# Patient Record
Sex: Male | Born: 1935 | Race: White | Hispanic: No | Marital: Married | Smoking: Former smoker
Health system: Southern US, Community
[De-identification: ages and names within clinical notes are randomized; demographics above are authoritative.]

## PROBLEM LIST (undated history)

## (undated) DIAGNOSIS — E785 Hyperlipidemia, unspecified: Secondary | ICD-10-CM

## (undated) DIAGNOSIS — E538 Deficiency of other specified B group vitamins: Secondary | ICD-10-CM

## (undated) DIAGNOSIS — M51369 Other intervertebral disc degeneration, lumbar region without mention of lumbar back pain or lower extremity pain: Secondary | ICD-10-CM

## (undated) DIAGNOSIS — N400 Enlarged prostate without lower urinary tract symptoms: Secondary | ICD-10-CM

## (undated) DIAGNOSIS — R972 Elevated prostate specific antigen [PSA]: Secondary | ICD-10-CM

## (undated) DIAGNOSIS — G629 Polyneuropathy, unspecified: Secondary | ICD-10-CM

## (undated) DIAGNOSIS — J849 Interstitial pulmonary disease, unspecified: Secondary | ICD-10-CM

## (undated) DIAGNOSIS — I2510 Atherosclerotic heart disease of native coronary artery without angina pectoris: Secondary | ICD-10-CM

## (undated) DIAGNOSIS — M47816 Spondylosis without myelopathy or radiculopathy, lumbar region: Secondary | ICD-10-CM

## (undated) DIAGNOSIS — I714 Abdominal aortic aneurysm, without rupture, unspecified: Secondary | ICD-10-CM

## (undated) DIAGNOSIS — I10 Essential (primary) hypertension: Secondary | ICD-10-CM

## (undated) DIAGNOSIS — N182 Chronic kidney disease, stage 2 (mild): Secondary | ICD-10-CM

## (undated) DIAGNOSIS — M1990 Unspecified osteoarthritis, unspecified site: Secondary | ICD-10-CM

## (undated) DIAGNOSIS — K589 Irritable bowel syndrome without diarrhea: Secondary | ICD-10-CM

## (undated) DIAGNOSIS — M5416 Radiculopathy, lumbar region: Secondary | ICD-10-CM

## (undated) DIAGNOSIS — E559 Vitamin D deficiency, unspecified: Secondary | ICD-10-CM

## (undated) DIAGNOSIS — I219 Acute myocardial infarction, unspecified: Secondary | ICD-10-CM

## (undated) DIAGNOSIS — K219 Gastro-esophageal reflux disease without esophagitis: Secondary | ICD-10-CM

## (undated) HISTORY — PX: CORONARY ARTERY BYPASS GRAFT: SHX141

## (undated) HISTORY — PX: PACEMAKER IMPLANT: EP1218

---

## 2018-06-08 ENCOUNTER — Other Ambulatory Visit: Payer: Self-pay | Admitting: Urology

## 2018-06-08 DIAGNOSIS — N2889 Other specified disorders of kidney and ureter: Secondary | ICD-10-CM

## 2018-06-12 ENCOUNTER — Other Ambulatory Visit: Payer: Self-pay

## 2018-07-04 ENCOUNTER — Ambulatory Visit
Admission: RE | Admit: 2018-07-04 | Discharge: 2018-07-04 | Disposition: A | Discharge status: Home / Self Care | Payer: BLUE CROSS/BLUE SHIELD | Source: Ambulatory Visit | Attending: Urology | Admitting: Urology

## 2018-07-04 ENCOUNTER — Encounter: Payer: Self-pay | Admitting: *Deleted

## 2018-07-04 ENCOUNTER — Other Ambulatory Visit: Payer: Self-pay

## 2018-07-04 DIAGNOSIS — N2889 Other specified disorders of kidney and ureter: Secondary | ICD-10-CM

## 2018-07-04 HISTORY — PX: IR RADIOLOGIST EVAL & MGMT: IMG5224

## 2018-07-04 NOTE — Progress Notes (Signed)
Patient ID: Rodney Ross, male   DOB: 07/23/1934, 84 y.o.   MRN: [id number]       Chief Complaint: Patient was consulted remotely today (TeleHealth) for a small right renal mass at the request of Hall,Marshall C.    Referring Physician(s): Hall,Marshall C  History of Present Illness: Rodney Ross is a 84 y.o. male with an incidental 2 cm right lower pole exophytic renal mass and recent episode of gross hematuria.  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  This was found he was recently in the hospital for acute diverticulitis and bacteremia.  He has recovered from the diverticulitis.  He was discharged without difficulty.  He has had urology follow-up with Dr. Nevada Crane.  He has comorbidities including his age, known coronary disease, previous coronary bypass, but remains fairly active.  He has moved back to to [place] in the last few years after living at the [facility].  No past medical history on file.    Allergies: Patient has no allergy information on record.  Medications: Prior to Admission medications   Not on File     No family history on file.  Social History   Socioeconomic History  . Marital status: Not on file    Spouse name: Not on file  . Number of children: Not on file  . Years of education: Not on file  . Highest education level: Not on file  Occupational History  . Not on file  Social Needs  . Financial resource strain: Not on file  . Food insecurity    Worry: Not on file    Inability: Not on file  . Transportation needs    Medical: Not on file    Non-medical: Not on file  Tobacco Use  . Smoking status: Not on file  Substance and Sexual Activity  . Alcohol use: Not on file  . Drug use: Not on file  . Sexual activity: Not on file  Lifestyle  . Physical activity    Days per week: Not on file    Minutes per session: Not on file  . Stress: Not on file  Relationships  . Social Herbalist on phone: Not on file    Gets  together: Not on file    Attends religious service: Not on file    Active member of club or organization: Not on file    Attends meetings of clubs or organizations: Not on file    Relationship status: Not on file  Other Topics Concern  . Not on file  Social History Narrative  . Not on file    Review of Systems  Review of Systems: A 12 point ROS discussed and pertinent positives are indicated in the HPI above.  All other systems are negative.  Physical Exam No direct physical exam was performed  Vital Signs: There were no vitals taken for this visit.  Imaging: No results found.  Labs:  CBC: No results for input(s): WBC, HGB, HCT, PLT in the last 8760 hours.  COAGS: No results for input(s): INR, APTT in the last 8760 hours.  BMP: No results for input(s): NA, K, CL, CO2, GLUCOSE, BUN, CALCIUM, CREATININE, GFRNONAA, GFRAA in the last 8760 hours.  Invalid input(s): CMP  LIVER FUNCTION TESTS: No results for input(s): BILITOT, AST, ALT, ALKPHOS, PROT, ALBUMIN in the last 8760 hours.  TUMOR MARKERS: No results for input(s): AFPTM, CEA, CA199, CHROMGRNA in the last 8760 hours.  Assessment and Plan:  84 year old male with  a incidental 2 cm right lower pole renal mass concerning for renal cell carcinoma by CT imaging with an episode of gross hematuria.  Today, I reevaluated the density measurements of the lesion.  There is a suggestion of possible fat density on the initial noncontrast CT.  Therefore, it is not entirely certain if this is a renal cell carcinoma versus a lipid poor angiomyolipoma.  Unfortunately MRI cannot be performed because the patient has a pacemaker.  Given his age and comorbidities main treatment options discussed were observation with serial imaging versus cryoablation.  For the gross hematuria he does plan to have elective cystoscopy in the next few weeks by Dr. Margo AyeHall.  The ablation procedure was reviewed in detail including the procedure, risk, benefits  and alternatives.  Expected goals, recovery and outcomes well as continued surveillance were all reviewed.  Biopsy could also performed at the time of ablation.  Surveillance also discussed.  After discussion, the patient is most comfortable with observation and serial imaging.  Certainly if the lesion shows any enlargement he still would be a candidate for ablation.  Plan: Repeat outpatient renal mass CT protocol in 6 months (January 2021)  Thank you for this interesting consult.  I greatly enjoyed meeting Rodney Ross and look forward to participating in their care.  A copy of this report was sent to the requesting provider on this date.  Electronically Signed: Berdine DanceMichael Grainger Mccarley 07/04/2018, 1:54 PM   I spent a total of  30 Minutes   in remote  clinical consultation, greater than 50% of which was counseling/coordinating care for this patient with a right renal mass.    Visit type: Audio only (telephone). Audio (no video) only due to Not necessary for this clinic visit. Alternative for in-person consultation at [facility], [address], [place], [place]. This visit type was conducted due to national recommendations for restrictions regarding the COVID-19 Pandemic (e.g. social distancing).  This format is felt to be most appropriate for this patient at this time.  All issues noted in this document were discussed and addressed.

## 2019-02-08 ENCOUNTER — Other Ambulatory Visit: Payer: Self-pay | Admitting: *Deleted

## 2019-02-08 DIAGNOSIS — N2889 Other specified disorders of kidney and ureter: Secondary | ICD-10-CM

## 2019-02-12 ENCOUNTER — Other Ambulatory Visit: Payer: Self-pay | Admitting: Interventional Radiology

## 2019-02-12 DIAGNOSIS — N2889 Other specified disorders of kidney and ureter: Secondary | ICD-10-CM

## 2019-02-27 ENCOUNTER — Ambulatory Visit
Admission: RE | Admit: 2019-02-27 | Discharge: 2019-02-27 | Disposition: A | Discharge status: Home / Self Care | Payer: BLUE CROSS/BLUE SHIELD | Source: Ambulatory Visit | Attending: Interventional Radiology | Admitting: Interventional Radiology

## 2019-02-27 ENCOUNTER — Other Ambulatory Visit: Payer: Self-pay

## 2019-02-27 DIAGNOSIS — N2889 Other specified disorders of kidney and ureter: Secondary | ICD-10-CM

## 2019-06-13 ENCOUNTER — Other Ambulatory Visit: Payer: Self-pay

## 2019-06-13 ENCOUNTER — Ambulatory Visit
Admission: RE | Admit: 2019-06-13 | Discharge: 2019-06-13 | Disposition: A | Discharge status: Home / Self Care | Payer: BLUE CROSS/BLUE SHIELD | Source: Ambulatory Visit | Attending: Interventional Radiology | Admitting: Interventional Radiology

## 2019-06-13 ENCOUNTER — Encounter: Payer: Self-pay | Admitting: *Deleted

## 2019-06-13 HISTORY — PX: IR RADIOLOGIST EVAL & MGMT: IMG5224

## 2019-06-13 NOTE — Progress Notes (Signed)
Patient ID: Rodney Ross, male   DOB: 25-Jul-1934, 85 y.o.   MRN: [id number]       Chief Complaint:  Small right renal mass by imaging compatible with renal cell carcinoma  Referring Physician(s): Dr. Nevada Crane  History of Present Illness: Rodney Ross is a 85 y.o. male with multiple comorbidities including elderly age, known coronary disease, previous coronary bypass, and prior GI bleed.  He was incidentally found to have a small right lower pole exophytic renal mass measuring 1.4 cm by CT which was performed for abdominal pain.  At that time he actually had diverticulitis.  He has recovered from this.  Overall he is stable and back to his baseline.  Recent GI bleed was treated at [facility] and late February 2021.  No recurrent bleeding episodes.  No past medical history on file.    Allergies: Patient has no allergy information on record.  Medications: Prior to Admission medications   Not on File     No family history on file.  Social History   Socioeconomic History  . Marital status: Not on file    Spouse name: Not on file  . Number of children: Not on file  . Years of education: Not on file  . Highest education level: Not on file  Occupational History  . Not on file  Tobacco Use  . Smoking status: Not on file  Substance and Sexual Activity  . Alcohol use: Not on file  . Drug use: Not on file  . Sexual activity: Not on file  Other Topics Concern  . Not on file  Social History Narrative  . Not on file   Social Determinants of Health   Financial Resource Strain:   . Difficulty of Paying Living Expenses:   Food Insecurity:   . Worried About Charity fundraiser in the Last Year:   . Arboriculturist in the Last Year:   Transportation Needs:   . Film/video editor (Medical):   Marland Kitchen Lack of Transportation (Non-Medical):   Physical Activity:   . Days of Exercise per Week:   . Minutes of Exercise per Session:   Stress:   . Feeling of Stress :   Social  Connections:   . Frequency of Communication with Friends and Family:   . Frequency of Social Gatherings with Friends and Family:   . Attends Religious Services:   . Active Member of Clubs or Organizations:   . Attends Archivist Meetings:   Marland Kitchen Marital Status:     Review of Systems  Review of Systems: A 12 point ROS discussed and pertinent positives are indicated in the HPI above.  All other systems are negative.  Physical Exam No direct physical exam was performed, telephone health visit today only. Vital Signs: There were no vitals taken for this visit.  Imaging: No results found.  Labs:  CBC: No results for input(s): WBC, HGB, HCT, PLT in the last 8760 hours.  COAGS: No results for input(s): INR, APTT in the last 8760 hours.  BMP: No results for input(s): NA, K, CL, CO2, GLUCOSE, BUN, CALCIUM, CREATININE, GFRNONAA, GFRAA in the last 8760 hours.  Invalid input(s): CMP  LIVER FUNCTION TESTS: No results for input(s): BILITOT, AST, ALT, ALKPHOS, PROT, ALBUMIN in the last 8760 hours.  TUMOR MARKERS: No results for input(s): AFPTM, CEA, CA199, CHROMGRNA in the last 8760 hours.  Assessment and Plan:  85 year old male with an incidentally found right lower pole renal mass compatible with  renal cell carcinoma by CT imaging.  Repeat surveillance imaging as 6 months was performed February 21, 2019 at [facility].  This demonstrates a stable 1.4 cm small exophytic posterior right renal mass with enhancement compatible with a small renal cell carcinoma.  No interval change in size.  No new renal abnormality.  He remains asymptomatic.  No recent flank or abdominal pain.  No interval fever or illness.  No hematuria.  Stability of the lesion supports continued surveillance at his age.  Plan: Repeat outpatient renal mass CT protocol in 6 months (November 2021) at [facility]  Electronically Signed: Berdine Dance 06/13/2019, 10:42 AM   I spent a total of     25 Minutes in remote  clinical consultation, greater than 50% of which was counseling/coordinating care for this patient with a small right renal mass..    Visit type: Audio only (telephone). Audio (no video) only due to patient's lack of internet/smartphone capability. Alternative for in-person consultation at [facility], [address] [place], [place], [place]. This visit type was conducted due to national recommendations for restrictions regarding the COVID-19 Pandemic (e.g. social distancing).  This format is felt to be most appropriate for this patient at this time.  All issues noted in this document were discussed and addressed.

## 2019-11-06 ENCOUNTER — Other Ambulatory Visit: Payer: Self-pay

## 2019-11-06 ENCOUNTER — Other Ambulatory Visit: Payer: Self-pay | Admitting: Interventional Radiology

## 2019-11-06 DIAGNOSIS — N2889 Other specified disorders of kidney and ureter: Secondary | ICD-10-CM

## 2019-11-27 ENCOUNTER — Ambulatory Visit
Admission: RE | Admit: 2019-11-27 | Discharge: 2019-11-27 | Disposition: A | Discharge status: Home / Self Care | Payer: BLUE CROSS/BLUE SHIELD | Source: Ambulatory Visit | Attending: Interventional Radiology | Admitting: Interventional Radiology

## 2019-11-27 ENCOUNTER — Other Ambulatory Visit: Payer: Self-pay

## 2019-11-27 ENCOUNTER — Encounter: Payer: Self-pay | Admitting: *Deleted

## 2019-11-27 DIAGNOSIS — N2889 Other specified disorders of kidney and ureter: Secondary | ICD-10-CM

## 2019-11-27 HISTORY — PX: IR RADIOLOGIST EVAL & MGMT: IMG5224

## 2019-11-27 NOTE — Progress Notes (Signed)
Patient ID: Rodney Ross, male   DOB: 26-Jan-1934, 85 y.o.   MRN: [id number]       Chief Complaint:  Small right renal mass by imaging concerning for renal cell carcinoma  Referring Physician(s): Dr. Margo Aye, [facility] urology  History of Present Illness: Rodney Ross is a 85 y.o. male who is followed closely for a 1.4 cm right lower pole renal mass.  Lesion was originally found in 2020 on a CT scan for abdominal pain.  At that time he had diverticulitis.  He has had interval surveillance imaging at [facility] 11/22/2019.  The right renal lesion is in the lower pole posterior exophytic location measuring 1.4 cm with some evidence of enhancement.  Imaging characteristics and size are stable.  No interval change or growth.  No other acute or significant renal abnormality.  Overall he remains at his baseline.  No past medical history on file.    Allergies: Patient has no allergy information on record.  Medications: Prior to Admission medications   Not on File     No family history on file.  Social History   Socioeconomic History  . Marital status: Not on file    Spouse name: Not on file  . Number of children: Not on file  . Years of education: Not on file  . Highest education level: Not on file  Occupational History  . Not on file  Tobacco Use  . Smoking status: Not on file  Substance and Sexual Activity  . Alcohol use: Not on file  . Drug use: Not on file  . Sexual activity: Not on file  Other Topics Concern  . Not on file  Social History Narrative  . Not on file   Social Determinants of Health   Financial Resource Strain:   . Difficulty of Paying Living Expenses: Not on file  Food Insecurity:   . Worried About Programme researcher, broadcasting/film/video in the Last Year: Not on file  . Ran Out of Food in the Last Year: Not on file  Transportation Needs:   . Lack of Transportation (Medical): Not on file  . Lack of Transportation (Non-Medical): Not on file  Physical Activity:    . Days of Exercise per Week: Not on file  . Minutes of Exercise per Session: Not on file  Stress:   . Feeling of Stress : Not on file  Social Connections:   . Frequency of Communication with Friends and Family: Not on file  . Frequency of Social Gatherings with Friends and Family: Not on file  . Attends Religious Services: Not on file  . Active Member of Clubs or Organizations: Not on file  . Attends Banker Meetings: Not on file  . Marital Status: Not on file      Review of Systems  Review of Systems: A 12 point ROS discussed and pertinent positives are indicated in the HPI above.  All other systems are negative.  Physical Exam No direct physical exam was performed, telephone health visit only because of Covid pandemic   Vital Signs: There were no vitals taken for this visit.  Imaging: No results found.  Labs:  CBC: No results for input(s): WBC, HGB, HCT, PLT in the last 8760 hours.  COAGS: No results for input(s): INR, APTT in the last 8760 hours.  BMP: No results for input(s): NA, K, CL, CO2, GLUCOSE, BUN, CALCIUM, CREATININE, GFRNONAA, GFRAA in the last 8760 hours.  Invalid input(s): CMP  LIVER FUNCTION TESTS: No  results for input(s): BILITOT, AST, ALT, ALKPHOS, PROT, ALBUMIN in the last 8760 hours.   Assessment and Plan:  85 year old male with a 1.4 cm stable right lower pole renal mass imaging characteristics are compatible with a small renal cell carcinoma.  Surveillance CT performed 11/22/2019 confirms stability dating back to 05/14/2018.  No new renal abnormality.  He remains asymptomatic from a renal standpoint.  No flank pain, abdominal pain, dysuria or hematuria.  Stability of the renal lesion continues to support surveillance at his age.  Plan: Increase surveillance interval to 9 months with a repeat outpatient renal mass CT protocol at [facility] (August 2022).   Electronically Signed: Berdine Dance 11/27/2019, 8:46  AM   I spent a total of    25 Minutes in remote  clinical consultation, greater than 50% of which was counseling/coordinating care for this patient with a 1.4 cm stable right renal mass.    Visit type: Audio only (telephone). Audio (no video) only due to patient's lack of internet/smartphone capability. Alternative for in-person consultation at [facility], [address] [place], [place], [place]. This visit type was conducted due to national recommendations for restrictions regarding the COVID-19 Pandemic (e.g. social distancing).  This format is felt to be most appropriate for this patient at this time.  All issues noted in this document were discussed and addressed.

## 2020-07-23 ENCOUNTER — Other Ambulatory Visit: Payer: Self-pay | Admitting: Interventional Radiology

## 2020-07-23 ENCOUNTER — Other Ambulatory Visit: Payer: Self-pay

## 2020-07-23 DIAGNOSIS — N2889 Other specified disorders of kidney and ureter: Secondary | ICD-10-CM

## 2020-08-20 ENCOUNTER — Encounter: Payer: Self-pay | Admitting: *Deleted

## 2020-08-20 ENCOUNTER — Other Ambulatory Visit: Payer: Self-pay

## 2020-08-20 ENCOUNTER — Ambulatory Visit
Admission: RE | Admit: 2020-08-20 | Discharge: 2020-08-20 | Disposition: A | Discharge status: Home / Self Care | Payer: BLUE CROSS/BLUE SHIELD | Source: Ambulatory Visit | Attending: Interventional Radiology | Admitting: Interventional Radiology

## 2020-08-20 DIAGNOSIS — N2889 Other specified disorders of kidney and ureter: Secondary | ICD-10-CM

## 2020-08-20 HISTORY — PX: IR RADIOLOGIST EVAL & MGMT: IMG5224

## 2020-08-20 NOTE — Progress Notes (Signed)
Patient ID: Rodney Ross, male   DOB: June 11, 1934, 86 y.o.   MRN: [id number]       Chief Complaint:  Small posterior right renal mass concerning for renal cell carcinoma by imaging  Referring Physician(s): [facility]  History of Present Illness: Rodney Ross is a 86 y.o. male who is undergoing surveillance imaging for a 1.4 cm right lower pole renal mass.  Lesion was originally found in 2020 on a CT scan for abdominal pain when he had diverticulitis.  He has had interval surveillance for approximately 2 years.  Most recent CT performed 08/14/2020 at [facility] hospital.  Lesion remains a stable.  No interval growth.  Stable size at 1.4 cm.  No other acute or significant renal abnormality.  Patient remains asymptomatic.  No significant flank or abdominal pain.   No past medical history on file.    Allergies: Patient has no allergy information on record.  Medications: Prior to Admission medications   Not on File     No family history on file.  Social History   Socioeconomic History   Marital status: Not on file    Spouse name: Not on file   Number of children: Not on file   Years of education: Not on file   Highest education level: Not on file  Occupational History   Not on file  Tobacco Use   Smoking status: Not on file   Smokeless tobacco: Not on file  Substance and Sexual Activity   Alcohol use: Not on file   Drug use: Not on file   Sexual activity: Not on file  Other Topics Concern   Not on file  Social History Narrative   Not on file   Social Determinants of Health   Financial Resource Strain: Not on file  Food Insecurity: Not on file  Transportation Needs: Not on file  Physical Activity: Not on file  Stress: Not on file  Social Connections: Not on file     Review of Systems  Review of Systems: A 12 point ROS discussed and pertinent positives are indicated in the HPI above.  All other systems are negative.  Physical Exam No direct  physical exam was performed, telephone health visit only today Vital Signs: There were no vitals taken for this visit.  Imaging: No results found.  Labs:  CBC: No results for input(s): WBC, HGB, HCT, PLT in the last 8760 hours.  COAGS: No results for input(s): INR, APTT in the last 8760 hours.  BMP: No results for input(s): NA, K, CL, CO2, GLUCOSE, BUN, CALCIUM, CREATININE, GFRNONAA, GFRAA in the last 8760 hours.  Invalid input(s): CMP  LIVER FUNCTION TESTS: No results for input(s): BILITOT, AST, ALT, ALKPHOS, PROT, ALBUMIN in the last 8760 hours.  Assessment and Plan:  86 year old male with a stable 1.4 cm right lower pole renal mass with imaging characteristics compatible with a small renal neoplasm.  Surveillance imaging performed 08/14/2020 confirms stability of the lesion dating back to 05/14/2018.  No new renal abnormality.  No interval enlargement.  He remains asymptomatic from a renal standpoint.  No flank pain or abdominal pain.  Negative for dysuria.  He did have 1 episode of recent hematuria however a follow-up urinalysis at the primary care office demonstrated no red cells in his urine per patient report.  Continued stability of the right renal lesion supports surveillance given his age.  Plan: Continue surveillance at 9 months with a repeat outpatient renal mass CT protocol at [facility] (May 2023)  He also was instructed if he has recurrent gross hematuria that he may need to have his bladder evaluated at some point by urology.  Thank you for this interesting consult.  I greatly enjoyed meeting Rodney Ross and look forward to participating in their care.  A copy of this report was sent to the requesting provider on this date.  Electronically Signed: Berdine Dance 08/20/2020, 2:59 PM   I spent a total of    25 Minutes in remote  clinical consultation, greater than 50% of which was counseling/coordinating care for this patient with a stable right  renal mass..    Visit type: Audio only (telephone). Audio (no video) only due to patient's lack of internet/smartphone capability. Alternative for in-person consultation at [facility], [address] [place], [place], [place]. This visit type was conducted due to national recommendations for restrictions regarding the COVID-19 Pandemic (e.g. social distancing).  This format is felt to be most appropriate for this patient at this time.  All issues noted in this document were discussed and addressed.

## 2021-04-23 ENCOUNTER — Other Ambulatory Visit: Payer: Self-pay

## 2021-04-23 ENCOUNTER — Encounter (HOSPITAL_BASED_OUTPATIENT_CLINIC_OR_DEPARTMENT_OTHER): Payer: Self-pay | Admitting: *Deleted

## 2021-04-23 ENCOUNTER — Emergency Department (HOSPITAL_BASED_OUTPATIENT_CLINIC_OR_DEPARTMENT_OTHER)
Admission: EM | Admit: 2021-04-23 | Discharge: 2021-04-23 | Disposition: A | Discharge status: Home / Self Care | Payer: Medicare Other | Attending: Emergency Medicine | Admitting: Emergency Medicine

## 2021-04-23 ENCOUNTER — Emergency Department (HOSPITAL_BASED_OUTPATIENT_CLINIC_OR_DEPARTMENT_OTHER): Payer: Medicare Other

## 2021-04-23 DIAGNOSIS — M25561 Pain in right knee: Secondary | ICD-10-CM | POA: Insufficient documentation

## 2021-04-23 DIAGNOSIS — Z7982 Long term (current) use of aspirin: Secondary | ICD-10-CM | POA: Insufficient documentation

## 2021-04-23 DIAGNOSIS — G8929 Other chronic pain: Secondary | ICD-10-CM | POA: Diagnosis not present

## 2021-04-23 DIAGNOSIS — I10 Essential (primary) hypertension: Secondary | ICD-10-CM | POA: Insufficient documentation

## 2021-04-23 DIAGNOSIS — Z85528 Personal history of other malignant neoplasm of kidney: Secondary | ICD-10-CM | POA: Insufficient documentation

## 2021-04-23 DIAGNOSIS — M79604 Pain in right leg: Secondary | ICD-10-CM | POA: Diagnosis not present

## 2021-04-23 LAB — CBC WITH DIFFERENTIAL/PLATELET
Abs Immature Granulocytes: 0.03 10*3/uL (ref 0.00–0.07)
Basophils Absolute: 0 10*3/uL (ref 0.0–0.1)
Basophils Relative: 1 %
Eosinophils Absolute: 0.1 10*3/uL (ref 0.0–0.5)
Eosinophils Relative: 2 %
HCT: 40.5 % (ref 39.0–52.0)
Hemoglobin: 13.1 g/dL (ref 13.0–17.0)
Immature Granulocytes: 1 %
Lymphocytes Relative: 13 %
Lymphs Abs: 0.8 10*3/uL (ref 0.7–4.0)
MCH: 30 pg (ref 26.0–34.0)
MCHC: 32.3 g/dL (ref 30.0–36.0)
MCV: 92.9 fL (ref 80.0–100.0)
Monocytes Absolute: 0.5 10*3/uL (ref 0.1–1.0)
Monocytes Relative: 9 %
Neutro Abs: 4.6 10*3/uL (ref 1.7–7.7)
Neutrophils Relative %: 74 %
Platelets: 109 10*3/uL — ABNORMAL LOW (ref 150–400)
RBC: 4.36 MIL/uL (ref 4.22–5.81)
RDW: 17.4 % — ABNORMAL HIGH (ref 11.5–15.5)
WBC: 6 10*3/uL (ref 4.0–10.5)
nRBC: 0 % (ref 0.0–0.2)

## 2021-04-23 LAB — BASIC METABOLIC PANEL
Anion gap: 8 (ref 5–15)
BUN: 33 mg/dL — ABNORMAL HIGH (ref 8–23)
CO2: 28 mmol/L (ref 22–32)
Calcium: 9 mg/dL (ref 8.9–10.3)
Chloride: 107 mmol/L (ref 98–111)
Creatinine, Ser: 1.2 mg/dL (ref 0.61–1.24)
GFR, Estimated: 60 mL/min — ABNORMAL LOW (ref >60)
Glucose, Bld: 119 mg/dL — ABNORMAL HIGH (ref 70–99)
Potassium: 4 mmol/L (ref 3.5–5.1)
Sodium: 143 mmol/L (ref 135–145)

## 2021-04-23 LAB — PROTIME-INR
INR: 1 (ref 0.8–1.2)
Prothrombin Time: 13.5 seconds (ref 11.4–15.2)

## 2021-04-23 LAB — APTT: aPTT: 28 seconds (ref 24–36)

## 2021-04-23 NOTE — ED Provider Notes (Signed)
?[facility] EMERGENCY DEPARTMENT ?Provider Note ? ? ?CSN: [id number] ?Arrival date & time: 04/23/21  1353 ? ?  ? ?History ? ?Chief Complaint  ?Patient presents with  ? Leg Pain  ? ? ?Bartholome BillJames Mohabir is a 87 y.o. male. ? ?Patient is an 87 year old male with a history of atrial fibrillation status post watchman no longer on Eliquis but 81 mg aspirin, renal cell carcinoma that is being monitored every 6 months, prior GI bleeding requiring resection, hypertension who has been having ongoing issues with his right knee for months now and had an MRI done yesterday by his orthopedist and Dr. Allena KatzPatel the radiologist called him today concerned that he may have a blood clot in his leg that needed further evaluation.  Patient does not notice any worsening swelling in his lower extremities over the last few months he has not had any chest pain or shortness of breath.  He has discussed the swelling with his doctor who reported that as long as it is not changing they want to continue his current medications.  Patient has been wearing a brace on the knee but has not had any surgeries or been immobilized. ? ?The history is provided by the patient, the spouse, a relative and medical records.  ?Leg Pain ? ?  ? ?Home Medications ?Prior to Admission medications   ?Not on File  ?   ? ?Allergies    ?Meloxicam, Atorvastatin, and Niacin   ? ?Review of Systems   ?Review of Systems ? ?Physical Exam ?Updated Vital Signs ?BP (!) 167/76   Pulse 70   Temp 97.6 ?F (36.4 ?C) (Oral)   Resp 17   Ht 5\' 6"  (1.676 m)   Wt 77.6 kg   SpO2 98%   BMI 27.60 kg/m?  ?Physical Exam ?Vitals and nursing note reviewed.  ?Constitutional:   ?   General: He is not in acute distress. ?   Appearance: He is well-developed.  ?   Comments: Chronically ill-appearing.  Appears frail  ?HENT:  ?   Head: Normocephalic and atraumatic.  ?Eyes:  ?   Conjunctiva/sclera: Conjunctivae normal.  ?   Pupils: Pupils are equal, round, and reactive to light.  ?Cardiovascular:   ?   Rate and Rhythm: Normal rate and regular rhythm.  ?   Pulses: Normal pulses.  ?   Heart sounds: No murmur heard. ?Pulmonary:  ?   Effort: Pulmonary effort is normal. No respiratory distress.  ?   Breath sounds: Normal breath sounds. No wheezing or rales.  ?Abdominal:  ?   General: There is no distension.  ?   Palpations: Abdomen is soft.  ?   Tenderness: There is no abdominal tenderness. There is no guarding or rebound.  ?Musculoskeletal:     ?   General: No tenderness. Normal range of motion.  ?   Cervical back: Normal range of motion and neck supple.  ?   Comments: Swelling noted of the right knee with pain with flexion and extension.  1+ pitting edema in bilateral lower extremities to the mid tib-fib slightly worse on the right  ?Skin: ?   General: Skin is warm and dry.  ?   Findings: No erythema or rash.  ?Neurological:  ?   Mental Status: He is alert and oriented to person, place, and time. Mental status is at baseline.  ?Psychiatric:     ?   Mood and Affect: Mood normal.     ?   Behavior: Behavior normal.  ? ? ?  ED Results / Procedures / Treatments   ?Labs ?(all labs ordered are listed, but only abnormal results are displayed) ?Labs Reviewed  ?BASIC METABOLIC PANEL - Abnormal; Notable for the following components:  ?    Result Value  ? Glucose, Bld 119 (*)   ? BUN 33 (*)   ? GFR, Estimated 60 (*)   ? All other components within normal limits  ?CBC WITH DIFFERENTIAL/PLATELET - Abnormal; Notable for the following components:  ? RDW 17.4 (*)   ? Platelets 109 (*)   ? All other components within normal limits  ?APTT  ?PROTIME-INR  ?URINALYSIS, ROUTINE W REFLEX MICROSCOPIC  ? ? ?EKG ?None ? ?Radiology ?US Venous Img Lower Right (DVT Study) ? ?Result Date: 04/23/2021 ?CLINICAL DATA:  Possible DVT on MRI yesterday EXAM: RIGHT LOWER EXTREMITY VENOUS DOPPLER ULTRASOUND TECHNIQUE: Gray-scale sonography with compression, as well as color and duplex ultrasound, were performed to evaluate the deep venous system(s) from  the level of the common femoral vein through the popliteal and proximal calf veins. COMPARISON:  None. FINDINGS: VENOUS Normal compressibility of the common femoral, superficial femoral, and popliteal veins, as well as the visualized calf veins. Visualized portions of profunda femoral vein and great saphenous vein unremarkable. No filling defects to suggest DVT on grayscale or color Doppler imaging. Doppler waveforms show normal direction of venous flow, normal respiratory plasticity and response to augmentation. Limited views of the contralateral common femoral vein are unremarkable. OTHER Right knee joint effusion is present. Small fluid collection in the popliteal fossa measuring 2.0 x 0.5 x 0.5 cm is most likely a baker cyst. Limitations: none IMPRESSION: No right lower extremity DVT. Electronically Signed   By: Acquanetta Belling M.D.   On: 04/23/2021 16:01   ? ?Procedures ?Procedures  ? ? ?Medications Ordered in ED ?Medications - No data to display ? ?ED Course/ Medical Decision Making/ A&P ?  ?                        ?Medical Decision Making ?Amount and/or Complexity of Data Reviewed ?External Data Reviewed: notes. ?Labs: ordered. Decision-making details documented in ED Course. ?Radiology: ordered. Decision-making details documented in ED Course. ? ? ?Elderly male presenting today due to concern for possible blood clot in his right leg.  He had an MRI done of his knee yesterday through [facility] and he reported they called him today and told him he may have a blood clot and he needed to come for evaluation.  Patient is taking an 81 mg aspirin but had to be taken off Eliquis in the past due to GI bleeding and had the Watchman procedure done for his A-fib.  He has been doing relatively well with this since the procedure several years ago.  He has not had any further bleeding.  Patient does have some swelling in the legs but it is bilateral.  Patient denies chest pain or shortness of breath.  Based on the  MRI from his external medical records it reported he had some edema around his popliteal vessel and they recommended an ultrasound to rule out DVT.  Patient is well-appearing on exam.  Family is at bedside. ? ?4:19 PM ?I independently interpreted patient's labs today which his CBC and BMP appear to be at baseline.  Coags are within normal limits.  Venous Doppler ultrasound today which I did evaluate the interpretation by the radiologist showed no evidence of DVT.  Findings were discussed with the patient  and his family.  He will follow-up with Dr. Allena Katz as planned.  Patient discharged home in good condition. ? ? ? ? ? ? ? ?Final Clinical Impression(s) / ED Diagnoses ?Final diagnoses:  ?Chronic pain of right knee  ? ? ?Rx / DC Orders ?ED Discharge Orders   ? ? None  ? ?  ? ? ?  ?Gwyneth Sprout, MD ?04/23/21 1619 ? ?

## 2021-04-23 NOTE — Discharge Instructions (Addendum)
No evidence of blood clot today.  Make sure you are taking the brace off a few times a day and moving your leg just to avoid developing a clot.  Continue aspirin at this time. ?

## 2021-04-23 NOTE — ED Notes (Signed)
Pt. Reports he has had pain in the R knee for a while and was seen for MRI yesterday and called this morning for hospitalization due to poss. Positive blood clot in the R leg ?

## 2021-04-23 NOTE — ED Triage Notes (Signed)
Pt had MRI yesterday of right leg and was told he has a blood clot. States he was told to come here for [place] ?

## 2021-05-04 ENCOUNTER — Other Ambulatory Visit: Payer: Self-pay | Admitting: Interventional Radiology

## 2021-05-04 ENCOUNTER — Other Ambulatory Visit: Payer: Self-pay

## 2021-05-04 DIAGNOSIS — N2889 Other specified disorders of kidney and ureter: Secondary | ICD-10-CM

## 2021-05-21 ENCOUNTER — Encounter: Payer: Self-pay | Admitting: *Deleted

## 2021-05-21 ENCOUNTER — Ambulatory Visit
Admission: RE | Admit: 2021-05-21 | Discharge: 2021-05-21 | Disposition: A | Discharge status: Home / Self Care | Payer: Medicare Other | Source: Ambulatory Visit | Attending: Interventional Radiology | Admitting: Interventional Radiology

## 2021-05-21 DIAGNOSIS — N2889 Other specified disorders of kidney and ureter: Secondary | ICD-10-CM

## 2021-05-21 HISTORY — PX: IR RADIOLOGIST EVAL & MGMT: IMG5224

## 2021-05-21 NOTE — Progress Notes (Signed)
Patient ID: Rodney Ross, male   DOB: 1934/06/23, 87 y.o.   MRN: [id number] ?    ? ? ?Chief Complaint: ? ?Small posterior right renal mass, surveillance ? ?Referring Physician(s): ?[facility] urology ? ?History of Present Illness: ?Rodney Ross is a 87 y.o. male who continues to undergo close surveillance for a 1.4 cm posterior right lower pole renal mass.  Lesion was originally found in 2024 CT scan being done for abdominal pain when he had diverticulitis.  He has had surveillance imaging now for 3 years.  Most recent CT performed 05/12/2021 at [facility].  This confirms a stable 1.2 cm small enhancing right renal mass posteriorly in the lower pole as before.  No interval change.  No other acute or significant renal abnormality.  No new renal lesions.  Patient remains asymptomatic from a urinary tract standpoint.  No flank or abdominal pain.  No recurrent hematuria. ? ?No past medical history on file. ? ? ? ?Allergies: ?Meloxicam, Atorvastatin, and Niacin ? ?Medications: ?Prior to Admission medications   ?Not on File  ?  ? ?No family history on file. ? ?Social History  ? ?Socioeconomic History  ? Marital status: Married  ?  Spouse name: Not on file  ? Number of children: Not on file  ? Years of education: Not on file  ? Highest education level: Not on file  ?Occupational History  ? Not on file  ?Tobacco Use  ? Smoking status: Former  ?  Types: Cigarettes  ? Smokeless tobacco: Never  ?Substance and Sexual Activity  ? Alcohol use: Yes  ? Drug use: Never  ? Sexual activity: Not on file  ?Other Topics Concern  ? Not on file  ?Social History Narrative  ? Not on file  ? ?Social Determinants of Health  ? ?Financial Resource Strain: Not on file  ?Food Insecurity: Not on file  ?Transportation Needs: Not on file  ?Physical Activity: Not on file  ?Stress: Not on file  ?Social Connections: Not on file  ? ? ? ?Review of Systems ? ?Review of Systems: A 12 point ROS discussed and pertinent positives are indicated in the  HPI above.  All other systems are negative. ? ?Physical Exam ?No direct physical exam was performed telephone health visit only today ?Vital Signs: ?There were no vitals taken for this visit. ? ?Imaging: ?US Venous Img Lower Right (DVT Study) ? ?Result Date: 04/23/2021 ?CLINICAL DATA:  Possible DVT on MRI yesterday EXAM: RIGHT LOWER EXTREMITY VENOUS DOPPLER ULTRASOUND TECHNIQUE: Gray-scale sonography with compression, as well as color and duplex ultrasound, were performed to evaluate the deep venous system(s) from the level of the common femoral vein through the popliteal and proximal calf veins. COMPARISON:  None. FINDINGS: VENOUS Normal compressibility of the common femoral, superficial femoral, and popliteal veins, as well as the visualized calf veins. Visualized portions of profunda femoral vein and great saphenous vein unremarkable. No filling defects to suggest DVT on grayscale or color Doppler imaging. Doppler waveforms show normal direction of venous flow, normal respiratory plasticity and response to augmentation. Limited views of the contralateral common femoral vein are unremarkable. OTHER Right knee joint effusion is present. Small fluid collection in the popliteal fossa measuring 2.0 x 0.5 x 0.5 cm is most likely a baker cyst. Limitations: none IMPRESSION: No right lower extremity DVT. Electronically Signed   By: Acquanetta Belling M.D.   On: 04/23/2021 16:01   ? ?Labs: ? ?CBC: ?Recent Labs  ?  04/23/21 ?1443  ?WBC 6.0  ?  HGB 13.1  ?HCT 40.5  ?PLT 109*  ? ? ?COAGS: ?Recent Labs  ?  04/23/21 ?1443  ?INR 1.0  ?APTT 28  ? ? ?BMP: ?Recent Labs  ?  04/23/21 ?1443  ?NA 143  ?K 4.0  ?CL 107  ?CO2 28  ?GLUCOSE 119*  ?BUN 33*  ?CALCIUM 9.0  ?CREATININE 1.20  ?GFRNONAA 60*  ? ? ?LIVER FUNCTION TESTS: ?No results for input(s): BILITOT, AST, ALT, ALKPHOS, PROT, ALBUMIN in the last 8760 hours. ? ?Assessment and Plan: ? ?87 year old male with a stable 1.4 cm posterior right lower pole renal mass with imaging characteristics  suggestive of a small renal neoplasm.  Surveillance imaging dates back to 2020.  Lesion has been stable for 3 years.  No new renal abnormality or interval enlargement.  He remains asymptomatic. ? ?Stability of the right renal lesion supports continued surveillance at his age. ? ?Plan: Continue surveillance at 1 year with a repeat outpatient renal mass CT protocol at [facility] (May 2024). ? ? ? ?Electronically Signed: ?Berdine Dance ?05/21/2021, 11:50 AM ? ? ?I spent a total of    25 Minutes in remote  clinical consultation, greater than 50% of which was counseling/coordinating care for this patient with a right renal mass..   ? ?Visit type: Audio only (telephone). Audio (no video) only due to patient's lack of internet/smartphone capability. ?Alternative for in-person consultation at [facility], [address] [place], [place], [place]. ?This visit type was conducted due to national recommendations for restrictions regarding the COVID-19 Pandemic (e.g. social distancing).  This format is felt to be most appropriate for this patient at this time.  All issues noted in this document were discussed and addressed.   ? ?

## 2022-01-09 ENCOUNTER — Emergency Department (HOSPITAL_COMMUNITY): Payer: Medicare Other

## 2022-01-09 ENCOUNTER — Emergency Department (HOSPITAL_COMMUNITY)
Admission: EM | Admit: 2022-01-09 | Discharge: 2022-01-10 | Discharge status: Against Medical Advice | Payer: Medicare Other | Attending: Emergency Medicine | Admitting: Emergency Medicine

## 2022-01-09 ENCOUNTER — Other Ambulatory Visit: Payer: Self-pay

## 2022-01-09 DIAGNOSIS — Z1152 Encounter for screening for COVID-19: Secondary | ICD-10-CM | POA: Insufficient documentation

## 2022-01-09 DIAGNOSIS — Z5321 Procedure and treatment not carried out due to patient leaving prior to being seen by health care provider: Secondary | ICD-10-CM | POA: Insufficient documentation

## 2022-01-09 DIAGNOSIS — R0602 Shortness of breath: Secondary | ICD-10-CM | POA: Diagnosis not present

## 2022-01-09 LAB — BASIC METABOLIC PANEL
Anion gap: 14 (ref 5–15)
BUN: 19 mg/dL (ref 8–23)
CO2: 25 mmol/L (ref 22–32)
Calcium: 9.2 mg/dL (ref 8.9–10.3)
Chloride: 102 mmol/L (ref 98–111)
Creatinine, Ser: 1.13 mg/dL (ref 0.61–1.24)
GFR, Estimated: >60 mL/min (ref >60)
Glucose, Bld: 140 mg/dL — ABNORMAL HIGH (ref 70–99)
Potassium: 4.2 mmol/L (ref 3.5–5.1)
Sodium: 141 mmol/L (ref 135–145)

## 2022-01-09 LAB — RESP PANEL BY RT-PCR (RSV, FLU A&B, COVID)  RVPGX2
Influenza A by PCR: POSITIVE — AB
Influenza B by PCR: NEGATIVE
Resp Syncytial Virus by PCR: NEGATIVE
SARS Coronavirus 2 by RT PCR: NEGATIVE

## 2022-01-09 LAB — CBC WITH DIFFERENTIAL/PLATELET
Abs Immature Granulocytes: 0.05 10*3/uL (ref 0.00–0.07)
Basophils Absolute: 0 10*3/uL (ref 0.0–0.1)
Basophils Relative: 0 %
Eosinophils Absolute: 0.1 10*3/uL (ref 0.0–0.5)
Eosinophils Relative: 2 %
HCT: 40.3 % (ref 39.0–52.0)
Hemoglobin: 13 g/dL (ref 13.0–17.0)
Immature Granulocytes: 1 %
Lymphocytes Relative: 6 %
Lymphs Abs: 0.4 10*3/uL — ABNORMAL LOW (ref 0.7–4.0)
MCH: 29.3 pg (ref 26.0–34.0)
MCHC: 32.3 g/dL (ref 30.0–36.0)
MCV: 91 fL (ref 80.0–100.0)
Monocytes Absolute: 0.7 10*3/uL (ref 0.1–1.0)
Monocytes Relative: 10 %
Neutro Abs: 5.6 10*3/uL (ref 1.7–7.7)
Neutrophils Relative %: 81 %
Platelets: 169 10*3/uL (ref 150–400)
RBC: 4.43 MIL/uL (ref 4.22–5.81)
RDW: 18.8 % — ABNORMAL HIGH (ref 11.5–15.5)
WBC: 6.8 10*3/uL (ref 4.0–10.5)
nRBC: 0 % (ref 0.0–0.2)

## 2022-01-09 NOTE — ED Provider Triage Note (Signed)
  Emergency Medicine Provider Triage Evaluation Note  MRN:  [id number]  Arrival date & time: 01/09/22    Medically screening exam initiated at 10:29 PM.   CC:   SOB/Cough   HPI:  Sopheap Boehle is a 87 y.o. year-old male presents to the ED with chief complaint of cough and SOB.  Has been worsening for the past week.  Denies fever or pain.  States he doesn't use O2 at home. O2 sat 92 on RA in triage.  History provided by patient. ROS:  -As included in HPI PE:   Vitals:   01/09/22 2229  BP: (!) 190/83  Pulse: 98  Resp: 18  Temp: 98.7 F (37.1 C)  SpO2: 95%    Non-toxic appearing No respiratory distress  MDM:  Based on signs and symptoms, URI is highest on my differential. I've ordered labs and imaging in triage to expedite lab/diagnostic workup.  Patient was informed that the remainder of the evaluation will be completed by another provider, this initial triage assessment does not replace that evaluation, and the importance of remaining in the ED until their evaluation is complete.    Roxy Horseman, PA-C 01/09/22 2231

## 2022-01-09 NOTE — ED Triage Notes (Signed)
Patient arrived with EMS from [facility] , reports persistent productive cough with chest congestion and mild SOB onset yesterday .

## 2022-01-10 NOTE — ED Notes (Signed)
Pt was able to call a cab and was seen getting into a taxi and leaving the ED.

## 2022-04-18 ENCOUNTER — Other Ambulatory Visit: Payer: Self-pay | Admitting: Interventional Radiology

## 2022-04-18 DIAGNOSIS — N2889 Other specified disorders of kidney and ureter: Secondary | ICD-10-CM

## 2022-06-08 ENCOUNTER — Ambulatory Visit
Admission: RE | Admit: 2022-06-08 | Discharge: 2022-06-08 | Disposition: A | Discharge status: Home / Self Care | Payer: Medicare Other | Source: Ambulatory Visit | Attending: Interventional Radiology | Admitting: Interventional Radiology

## 2022-06-08 DIAGNOSIS — N2889 Other specified disorders of kidney and ureter: Secondary | ICD-10-CM
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
## 2022-11-16 ENCOUNTER — Emergency Department (HOSPITAL_BASED_OUTPATIENT_CLINIC_OR_DEPARTMENT_OTHER): Payer: Medicare Other

## 2022-11-16 ENCOUNTER — Emergency Department (HOSPITAL_BASED_OUTPATIENT_CLINIC_OR_DEPARTMENT_OTHER)
Admission: EM | Admit: 2022-11-16 | Discharge: 2022-11-16 | Disposition: A | Discharge status: Home / Self Care | Payer: Medicare Other | Attending: Emergency Medicine | Admitting: Emergency Medicine

## 2022-11-16 ENCOUNTER — Encounter (HOSPITAL_BASED_OUTPATIENT_CLINIC_OR_DEPARTMENT_OTHER): Payer: Self-pay | Admitting: Emergency Medicine

## 2022-11-16 ENCOUNTER — Other Ambulatory Visit: Payer: Self-pay

## 2022-11-16 DIAGNOSIS — S51012A Laceration without foreign body of left elbow, initial encounter: Secondary | ICD-10-CM

## 2022-11-16 DIAGNOSIS — W19XXXA Unspecified fall, initial encounter: Secondary | ICD-10-CM

## 2022-11-16 DIAGNOSIS — S0003XA Contusion of scalp, initial encounter: Secondary | ICD-10-CM | POA: Diagnosis not present

## 2022-11-16 DIAGNOSIS — W010XXA Fall on same level from slipping, tripping and stumbling without subsequent striking against object, initial encounter: Secondary | ICD-10-CM | POA: Insufficient documentation

## 2022-11-16 DIAGNOSIS — S51011A Laceration without foreign body of right elbow, initial encounter: Secondary | ICD-10-CM | POA: Insufficient documentation

## 2022-11-16 DIAGNOSIS — S0990XA Unspecified injury of head, initial encounter: Secondary | ICD-10-CM | POA: Insufficient documentation

## 2022-11-16 NOTE — ED Triage Notes (Signed)
Mechanical fall , head injury , occipital hematoma . Alert and oriented x 4 , denies neck pain , on baby aspirin daily

## 2022-11-16 NOTE — ED Provider Notes (Signed)
  Physical Exam  BP (!) 161/99   Pulse 71   Temp (!) 97.4 F (36.3 C) (Oral)   Resp 18   Wt 61.2 kg   SpO2 97%   BMI 21.79 kg/m   Physical Exam Vitals and nursing note reviewed.  HENT:     Head: Normocephalic and atraumatic.  Eyes:     Pupils: Pupils are equal, round, and reactive to light.  Cardiovascular:     Rate and Rhythm: Normal rate and regular rhythm.  Pulmonary:     Effort: Pulmonary effort is normal.     Breath sounds: Normal breath sounds.  Abdominal:     Palpations: Abdomen is soft.     Tenderness: There is no abdominal tenderness.  Musculoskeletal:     Comments: Small skin tear over posterior aspect of left elbow 1 cm x 4 cm  Skin:    General: Skin is warm and dry.  Neurological:     Mental Status: He is alert.  Psychiatric:        Mood and Affect: Mood normal.     Procedures  Procedures  ED Course / MDM   Clinical Course as of 11/16/22 1728  Wed Nov 16, 2022  1727 CT head CT C-spine showed no acute traumatic findings.  Skin tear of left elbow irrigated and repaired with 3 Steri-Strips and covered with gauze dressing.  Patient able to ambulate in the emergency department at baseline.  Stable for discharge [MP]    Clinical Course User Index [MP] Royanne Foots, DO   Medical Decision Making I, Estelle June DO, have assumed care of this patient from the previous provider pending CT head CT C-spine reevaluation and disposition  Amount and/or Complexity of Data Reviewed Radiology: ordered.   Final diagnosis Fall initial encounter Injury of head initial encounter Skin tear of left elbow       Royanne Foots, DO 11/16/22 1728

## 2022-11-16 NOTE — ED Notes (Signed)
Pt states, "I am not having pain at all"

## 2022-11-16 NOTE — ED Triage Notes (Signed)
PER EMS:  witnessed fall around 12 pm at [facility].  Staff at facility was concerned as he wasn't acting like himself.  EMS states pt has dementia and answered questions appropiately.  Skin tear on elbow, pt did hit his head, no LOC, no thinners.  Pt wears leg brace on right which locks up occasionally.  VSS.  CBG 117

## 2022-11-16 NOTE — ED Provider Notes (Signed)
[place] [facility] Provider Note   CSN: [id number] Arrival date & time: 11/16/22  1301     History  Chief Complaint  Patient presents with   Rodney Ross is a 88 y.o. male.  Patient is a 88 year old male who presents after a fall.  He lives at [facility].  He states he was walking and tripped on a dog bed.  He fell down onto his bottom and then hit his head back on the wall.  He denies any loss of consciousness.  He is not on anticoagulants.  Has a small bump on the back of his head.  He denies any neck or back pain.  He denies any other injuries other than a skin tear to his left elbow.  He overall says he has been feeling well.  It is noted that he has a history of dementia but he is alert and oriented x 3 currently.  No recent illnesses.       Home Medications Prior to Admission medications   Not on File      Allergies    Meloxicam, Atorvastatin, and Niacin    Review of Systems   Review of Systems  Constitutional:  Negative for fatigue and fever.  HENT:  Negative for congestion and rhinorrhea.   Eyes:  Negative for visual disturbance.  Respiratory:  Negative for cough and shortness of breath.   Cardiovascular:  Negative for chest pain.  Gastrointestinal:  Negative for abdominal pain, diarrhea, nausea and vomiting.  Genitourinary:  Negative for dysuria.  Musculoskeletal:  Negative for arthralgias, back pain and neck pain.  Skin:  Positive for wound.  Neurological:  Negative for syncope and headaches.    Physical Exam Updated Vital Signs BP (!) 163/74   Pulse (!) 58   Temp (!) 97.4 F (36.3 C) (Oral)   Resp 20   Wt 61.2 kg   SpO2 95%   BMI 21.79 kg/m  Physical Exam Constitutional:      Appearance: He is well-developed.  HENT:     Head: Normocephalic.     Comments: Small hematoma to the occipital region with overlying abrasion Eyes:     Pupils: Pupils are equal, round, and reactive to light.   Neck:     Comments: No pain along the cervical, thoracic or lumbosacral spine Cardiovascular:     Rate and Rhythm: Normal rate and regular rhythm.     Heart sounds: Normal heart sounds.  Pulmonary:     Effort: Pulmonary effort is normal. No respiratory distress.     Breath sounds: Normal breath sounds. No wheezing or rales.  Chest:     Chest wall: No tenderness.  Abdominal:     General: Bowel sounds are normal.     Palpations: Abdomen is soft.     Tenderness: There is no abdominal tenderness. There is no guarding or rebound.  Musculoskeletal:        General: Normal range of motion.     Comments: No pain on palpation or range of motion of extremities, there is a skin tear to the left elbow.  No underlying bony tenderness.  Lymphadenopathy:     Cervical: No cervical adenopathy.  Skin:    General: Skin is warm and dry.     Findings: No rash.  Neurological:     General: No focal deficit present.     Mental Status: He is alert and oriented to person, place, and time.  ED Results / Procedures / Treatments   Labs (all labs ordered are listed, but only abnormal results are displayed) Labs Reviewed - No data to display  EKG None  Radiology No results found.  Procedures Procedures    Medications Ordered in ED Medications - No data to display  ED Course/ Medical Decision Making/ A&P                                 Medical Decision Making Amount and/or Complexity of Data Reviewed Radiology: ordered.   Patient is a 88 year old who presents after mechanical fall.  He has a skin tear on his right elbow and hematoma on his occiput.  He otherwise is well-appearing.  His vital signs are stable.  He is alert and oriented.  No neurologic deficits.  He is awaiting CT imaging of his head and cervical spine.  His wound was cleaned and dressed.  Care turned over to Dr. Elayne Snare pending CT results.  Final Clinical Impression(s) / ED Diagnoses Final diagnoses:  Fall, initial  encounter  Injury of head, initial encounter    Rx / DC Orders ED Discharge Orders     None         Rolan Bucco, MD 11/16/22 1527

## 2022-11-16 NOTE — Discharge Instructions (Signed)
You were seen in the emerged ferment after a fall The CAT scan taken of your head and neck did not show any new traumatic findings The skin tear of your left elbow was repaired with Steri-Strips Please keep this area clean and dry for the next 1 to 2 days Return to the emerged part for severe pain, repeated falls or any other concerns Otherwise please follow-up with your primary care doctor morning for reevaluation

## 2023-02-01 ENCOUNTER — Other Ambulatory Visit: Payer: Self-pay

## 2023-02-01 ENCOUNTER — Encounter (HOSPITAL_BASED_OUTPATIENT_CLINIC_OR_DEPARTMENT_OTHER): Payer: Self-pay

## 2023-02-01 ENCOUNTER — Observation Stay (HOSPITAL_BASED_OUTPATIENT_CLINIC_OR_DEPARTMENT_OTHER)
Admission: EM | Admit: 2023-02-01 | Discharge: 2023-02-03 | Disposition: A | Discharge status: Home / Self Care | Payer: Medicare Other | Attending: Family Medicine | Admitting: Family Medicine

## 2023-02-01 ENCOUNTER — Emergency Department (HOSPITAL_BASED_OUTPATIENT_CLINIC_OR_DEPARTMENT_OTHER): Payer: Medicare Other

## 2023-02-01 DIAGNOSIS — I4891 Unspecified atrial fibrillation: Secondary | ICD-10-CM | POA: Diagnosis not present

## 2023-02-01 DIAGNOSIS — I2510 Atherosclerotic heart disease of native coronary artery without angina pectoris: Secondary | ICD-10-CM | POA: Diagnosis not present

## 2023-02-01 DIAGNOSIS — I130 Hypertensive heart and chronic kidney disease with heart failure and stage 1 through stage 4 chronic kidney disease, or unspecified chronic kidney disease: Secondary | ICD-10-CM | POA: Insufficient documentation

## 2023-02-01 DIAGNOSIS — M1990 Unspecified osteoarthritis, unspecified site: Secondary | ICD-10-CM | POA: Diagnosis not present

## 2023-02-01 DIAGNOSIS — Z951 Presence of aortocoronary bypass graft: Secondary | ICD-10-CM | POA: Diagnosis not present

## 2023-02-01 DIAGNOSIS — I4821 Permanent atrial fibrillation: Secondary | ICD-10-CM | POA: Diagnosis not present

## 2023-02-01 DIAGNOSIS — K5711 Diverticulosis of small intestine without perforation or abscess with bleeding: Secondary | ICD-10-CM | POA: Diagnosis not present

## 2023-02-01 DIAGNOSIS — Z7982 Long term (current) use of aspirin: Secondary | ICD-10-CM | POA: Diagnosis not present

## 2023-02-01 DIAGNOSIS — Z950 Presence of cardiac pacemaker: Secondary | ICD-10-CM | POA: Insufficient documentation

## 2023-02-01 DIAGNOSIS — I5022 Chronic systolic (congestive) heart failure: Secondary | ICD-10-CM | POA: Diagnosis not present

## 2023-02-01 DIAGNOSIS — I951 Orthostatic hypotension: Secondary | ICD-10-CM | POA: Insufficient documentation

## 2023-02-01 DIAGNOSIS — D649 Anemia, unspecified: Secondary | ICD-10-CM | POA: Diagnosis not present

## 2023-02-01 DIAGNOSIS — K921 Melena: Principal | ICD-10-CM | POA: Insufficient documentation

## 2023-02-01 DIAGNOSIS — Z87898 Personal history of other specified conditions: Secondary | ICD-10-CM

## 2023-02-01 DIAGNOSIS — Z87891 Personal history of nicotine dependence: Secondary | ICD-10-CM | POA: Insufficient documentation

## 2023-02-01 DIAGNOSIS — R933 Abnormal findings on diagnostic imaging of other parts of digestive tract: Secondary | ICD-10-CM | POA: Insufficient documentation

## 2023-02-01 DIAGNOSIS — Z79899 Other long term (current) drug therapy: Secondary | ICD-10-CM | POA: Diagnosis not present

## 2023-02-01 DIAGNOSIS — N289 Disorder of kidney and ureter, unspecified: Secondary | ICD-10-CM

## 2023-02-01 DIAGNOSIS — K2950 Unspecified chronic gastritis without bleeding: Secondary | ICD-10-CM | POA: Diagnosis not present

## 2023-02-01 DIAGNOSIS — K922 Gastrointestinal hemorrhage, unspecified: Secondary | ICD-10-CM | POA: Insufficient documentation

## 2023-02-01 DIAGNOSIS — N182 Chronic kidney disease, stage 2 (mild): Secondary | ICD-10-CM | POA: Diagnosis not present

## 2023-02-01 DIAGNOSIS — L8990 Pressure ulcer of unspecified site, unspecified stage: Secondary | ICD-10-CM | POA: Insufficient documentation

## 2023-02-01 HISTORY — DX: Interstitial pulmonary disease, unspecified: J84.9

## 2023-02-01 HISTORY — DX: Chronic kidney disease, stage 2 (mild): N18.2

## 2023-02-01 HISTORY — DX: Elevated prostate specific antigen (PSA): R97.20

## 2023-02-01 HISTORY — DX: Gastro-esophageal reflux disease without esophagitis: K21.9

## 2023-02-01 HISTORY — DX: Polyneuropathy, unspecified: G62.9

## 2023-02-01 HISTORY — DX: Other intervertebral disc degeneration, lumbar region without mention of lumbar back pain or lower extremity pain: M51.369

## 2023-02-01 HISTORY — DX: Spondylosis without myelopathy or radiculopathy, lumbar region: M47.816

## 2023-02-01 HISTORY — DX: Unspecified osteoarthritis, unspecified site: M19.90

## 2023-02-01 HISTORY — DX: Acute myocardial infarction, unspecified: I21.9

## 2023-02-01 HISTORY — DX: Abdominal aortic aneurysm, without rupture, unspecified: I71.40

## 2023-02-01 HISTORY — DX: Vitamin D deficiency, unspecified: E55.9

## 2023-02-01 HISTORY — DX: Atherosclerotic heart disease of native coronary artery without angina pectoris: I25.10

## 2023-02-01 HISTORY — DX: Essential (primary) hypertension: I10

## 2023-02-01 HISTORY — DX: Deficiency of other specified B group vitamins: E53.8

## 2023-02-01 HISTORY — DX: Benign prostatic hyperplasia without lower urinary tract symptoms: N40.0

## 2023-02-01 HISTORY — DX: Irritable bowel syndrome, unspecified: K58.9

## 2023-02-01 HISTORY — DX: Hyperlipidemia, unspecified: E78.5

## 2023-02-01 HISTORY — DX: Radiculopathy, lumbar region: M54.16

## 2023-02-01 LAB — CBC
HCT: 36.4 % — ABNORMAL LOW (ref 39.0–52.0)
Hemoglobin: 11.3 g/dL — ABNORMAL LOW (ref 13.0–17.0)
MCH: 26.7 pg (ref 26.0–34.0)
MCHC: 31 g/dL (ref 30.0–36.0)
MCV: 86.1 fL (ref 80.0–100.0)
Platelets: 139 10*3/uL — ABNORMAL LOW (ref 150–400)
RBC: 4.23 MIL/uL (ref 4.22–5.81)
RDW: 19.8 % — ABNORMAL HIGH (ref 11.5–15.5)
WBC: 6.8 10*3/uL (ref 4.0–10.5)
nRBC: 0 % (ref 0.0–0.2)

## 2023-02-01 LAB — OCCULT BLOOD X 1 CARD TO LAB, STOOL: Fecal Occult Bld: NEGATIVE

## 2023-02-01 LAB — COMPREHENSIVE METABOLIC PANEL
ALT: 10 U/L (ref 0–44)
AST: 12 U/L — ABNORMAL LOW (ref 15–41)
Albumin: 3.6 g/dL (ref 3.5–5.0)
Alkaline Phosphatase: 77 U/L (ref 38–126)
Anion gap: 7 (ref 5–15)
BUN: 23 mg/dL (ref 8–23)
CO2: 30 mmol/L (ref 22–32)
Calcium: 9 mg/dL (ref 8.9–10.3)
Chloride: 99 mmol/L (ref 98–111)
Creatinine, Ser: 0.97 mg/dL (ref 0.61–1.24)
GFR, Estimated: >60 mL/min (ref >60)
Glucose, Bld: 100 mg/dL — ABNORMAL HIGH (ref 70–99)
Potassium: 4.4 mmol/L (ref 3.5–5.1)
Sodium: 136 mmol/L (ref 135–145)
Total Bilirubin: 0.9 mg/dL (ref 0.0–1.2)
Total Protein: 6.7 g/dL (ref 6.5–8.1)

## 2023-02-01 MED ORDER — IOHEXOL 350 MG/ML SOLN
100.0000 mL | Freq: Once | INTRAVENOUS | Status: AC | PRN
  Administered 2023-02-01: 100 mL via INTRAVENOUS

## 2023-02-01 MED ORDER — PANTOPRAZOLE SODIUM 40 MG IV SOLR
80.0000 mg | Freq: Once | INTRAVENOUS | Status: AC
  Administered 2023-02-01: 80 mg via INTRAVENOUS
  Filled 2023-02-01: qty 20

## 2023-02-01 NOTE — ED Triage Notes (Signed)
Pt from [facility], pt went to bathroom afterwards staff saw black stool when he wiped, blood was on the tissue this afternoon.

## 2023-02-01 NOTE — ED Notes (Signed)
[place] ice given to pt, needs to be NPO after MN in case pt has endoscopy/colonoscopy tomorrow

## 2023-02-01 NOTE — ED Provider Notes (Cosign Needed Addendum)
[place] [facility] Provider Note   CSN: [id number] Arrival date & time: 02/01/23  1520  History  Chief Complaint  Patient presents with   Melena   Rodney Ross is a 89 y.o. male with past history of chronic heart failure, coronary artery disease s/p CABG in 2005, atrial fibrillation s/p Watchman, and prior severe small intestine bleed requiring an ex lap, presenting with concerns for melena.  His daughter and caregiver at bedside.  He reports an episode of dark stool today and some frank blood when wiping.  He is also reported some lightheadedness and dizziness recently, which he says is new. He has been taking aspirin 81 mg/day but is not taking any other anticoagulation. He is not taking iron. He denies any recent fever/chills, abdominal pain, shortness of breath, chest pain, nausea or vomiting.  He has had a poor appetite recently.    Home Medications Prior to Admission medications   Medication Sig Start Date End Date Taking? Authorizing Provider  acetaminophen (TYLENOL) 650 MG CR tablet Take 650 mg by mouth 2 (two) times daily as needed for pain.   Yes [provider]  alendronate (FOSAMAX) 70 MG tablet Take 70 mg by mouth once a week. Take with a full glass of water on an empty stomach.   Yes [provider]  aspirin EC 81 MG tablet Take 81 mg by mouth daily. Swallow whole.   Yes [provider]  benzonatate (TESSALON) 200 MG capsule Take 200 mg by mouth 3 (three) times daily as needed for cough.   Yes [provider]  carvedilol (COREG) 3.125 MG tablet Take 3.125 mg by mouth 2 (two) times daily with a meal. Check BP and HR Hold if HR is < 60 Hold if sys is < 100   Yes [provider]  dicyclomine (BENTYL) 10 MG capsule Take 10 mg by mouth 2 (two) times daily as needed for spasms.   Yes [provider]  finasteride (PROSCAR) 5 MG tablet Take 5 mg by mouth at bedtime.   Yes [provider]  furosemide (LASIX) 20 MG tablet Take 20 mg by mouth daily.   Yes [provider]  gabapentin (NEURONTIN) 300 MG capsule Take 300 mg by mouth 2 (two) times daily.   Yes [provider]  guaifenesin (ROBITUSSIN) 100 MG/5ML syrup Take 200 mg by mouth 3 (three) times daily as needed for cough.   Yes [provider]  magnesium oxide (MAG-OX) 400 (240 Mg) MG tablet Take 400 mg by mouth 2 (two) times daily.   Yes [provider]  nitroGLYCERIN (NITROSTAT) 0.4 MG SL tablet Place 0.4 mg under the tongue every 5 (five) minutes as needed for chest pain (x 2 doses).   Yes [provider]  nortriptyline (PAMELOR) 10 MG capsule Take 10 mg by mouth at bedtime.   Yes [provider]  pantoprazole (PROTONIX) 40 MG tablet Take 40 mg by mouth 2 (two) times daily.   Yes [provider]  polyethylene glycol (MIRALAX / GLYCOLAX) 17 g packet Take 17 g by mouth daily.   Yes [provider]  potassium chloride (MICRO-K) 10 MEQ CR capsule Take 10 mEq by mouth daily.   Yes [provider]  rosuvastatin (CRESTOR) 20 MG tablet Take 20 mg by mouth at bedtime.   Yes [provider]     Allergies    Meloxicam, Atorvastatin, Niacin, and Shellfish allergy    Review of Systems  Review of Systems  Constitutional: Negative.   HENT: Negative.    Eyes: Negative.   Respiratory: Negative.    Cardiovascular: Negative.   Gastrointestinal:  Positive for anal bleeding and blood in stool. Negative for constipation, diarrhea, nausea and vomiting.  Endocrine: Negative.   Genitourinary: Negative.   Musculoskeletal: Negative.   Skin: Negative.   Allergic/Immunologic: Negative.   Neurological:  Positive for light-headedness.  Hematological: Negative.   Psychiatric/Behavioral: Negative.      Physical Exam Updated Vital Signs BP 132/64   Pulse 77   Temp 97.8 F (36.6 C) (Oral)   Resp 18   Ht 5\' 5"  (1.651 m)   Wt 57.2 kg    SpO2 96%   BMI 20.97 kg/m  Physical Exam Constitutional:      Appearance: Normal appearance.  HENT:     Mouth/Throat:     Mouth: Mucous membranes are moist.  Cardiovascular:     Rate and Rhythm: Normal rate.     Heart sounds: Murmur heard.  Pulmonary:     Effort: Pulmonary effort is normal.     Breath sounds: Normal breath sounds.  Abdominal:     General: Abdomen is flat. There is no distension.     Palpations: Abdomen is soft.     Tenderness: There is no abdominal tenderness.  Musculoskeletal:        General: Normal range of motion.  Neurological:     General: No focal deficit present.     Mental Status: He is alert and oriented to person, place, and time.    ED Results / Procedures / Treatments   Labs (all labs ordered are listed, but only abnormal results are displayed) Labs Reviewed  COMPREHENSIVE METABOLIC PANEL - Abnormal; Notable for the following components:      Result Value   Glucose, Bld 100 (*)    AST 12 (*)    All other components within normal limits  CBC - Abnormal; Notable for the following components:   Hemoglobin 11.3 (*)    HCT 36.4 (*)    RDW 19.8 (*)    Platelets 139 (*)    All other components within normal limits  OCCULT BLOOD X 1 CARD TO LAB, STOOL   EKG None  Radiology CT Angio Abd/Pel W and/or Wo Contrast Result Date: 02/01/2023 CLINICAL DATA:  Black tarry stool.  Concern for GI bleed. EXAM: CTA ABDOMEN AND PELVIS WITHOUT AND WITH CONTRAST TECHNIQUE: Multidetector CT imaging of the abdomen and pelvis was performed using the standard protocol during bolus administration of intravenous contrast. Multiplanar reconstructed images and MIPs were obtained and reviewed to evaluate the vascular anatomy. RADIATION DOSE REDUCTION: This exam was performed according to the departmental dose-optimization program which includes automated exposure control, adjustment of the mA and/or kV according to patient size and/or use of iterative reconstruction  technique. CONTRAST:  OMNIPAQUE IOHEXOL 350 MG/ML SOLN COMPARISON:  Abdominal CT dated 05/23/2022. FINDINGS: Evaluation of this exam is limited due to respiratory motion. VASCULAR Aorta: Advanced atherosclerotic calcification of the abdominal aorta. There is a 3.4 cm fusiform infrarenal abdominal aortic aneurysm. No aortic dissection. No periaortic fluid collection. Celiac: Atherosclerotic calcification of the origin of the celiac trunk. The celiac artery remains patent. SMA: Advanced atherosclerotic calcification of the origin of the SMA. The SMA remains patent. Renals: Advanced atherosclerotic calcification of the ostia of the renal arteries. The renal arteries are patent. IMA: The IMA is poorly opacified. Inflow: Advanced atherosclerotic calcification of the iliac arteries. The iliac arteries remain  patent. No dissection. Proximal Outflow: Advanced atherosclerotic calcification. The visualized proximal outflow appears patent. Veins: The IVC is unremarkable. The SMV, splenic vein, and main portal vein are patent. No portal venous gas. Review of the MIP images confirms the above findings. NON-VASCULAR Lower chest: Partially visualized bilateral pleural effusions, right greater than left with associated compressive atelectasis of the adjacent lungs. Pneumonia is not excluded. There is advanced 3 vessel coronary vascular calcification and postsurgical changes of CABG. Cardiac pacemaker noted. No intra-abdominal free air.  No significant free fluid. Hepatobiliary: Heterogeneous enhancement of the liver. No biliary dilatation. Cholecystectomy. Pancreas: Unremarkable. No pancreatic ductal dilatation or surrounding inflammatory changes. Spleen: Normal in size without focal abnormality. Adrenals/Urinary Tract: The adrenal glands unremarkable. There is no hydronephrosis or nephrolithiasis on either side. Renal vascular calcifications noted. Poorly visualized 8 mm exophytic hypodense lesion from the posterior inferior  pole of the right kidney, previously considered neoplastic. This is not characterized on today's exam due to small size and suboptimal visualization. However, there appears to be enhancement on postcontrast images. The urinary bladder is grossly unremarkable. Stomach/Bowel: Evaluation for GI bleed is limited due to presence of high density stool throughout the colon likely mixed with prior oral contrast. There is enhancement of the gastric wall which may be physiologic. Gastritis is less likely. There is severe colonic diverticulosis. There is no bowel obstruction or active inflammation. No evidence of active GI bleed. The appendix is not visualized with certainty. No inflammatory changes identified in the right lower quadrant. Lymphatic: No adenopathy. Reproductive: The prostate is grossly unremarkable. Other: Mild diffuse subcutaneous edema. Musculoskeletal: Osteopenia with degenerative changes. No acute osseous pathology. IMPRESSION: 1. No evidence of active GI bleed. 2. Severe colonic diverticulosis. No bowel obstruction. 3. Partially visualized bilateral pleural effusions, right greater than left with associated compressive atelectasis of the adjacent lungs. Pneumonia is not excluded. 4. 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 5.  Aortic Atherosclerosis (ICD10-I70.0). Electronically Signed   By: Elgie Collard M.D.   On: 02/01/2023 19:08    Medications Ordered in ED Medications  iohexol (OMNIPAQUE) 350 MG/ML injection 100 mL (100 mLs Intravenous Contrast Given 02/01/23 1825)  pantoprazole (PROTONIX) injection 80 mg (80 mg Intravenous Given 02/01/23 2129)    ED Course/ Medical Decision Making/ A&P  Medical Decision Making Amount and/or Complexity of Data Reviewed Labs: ordered. Radiology: ordered.  Risk Prescription drug management. Decision regarding hospitalization.  Eyan Gustaveson is an  89 year old who presents with concerns for melena.  CBC shows normocytic anemia with a hemoglobin of 11.3, down from 12.9 back in early December.  He does note some new lightheadedness/dizziness. CMP was unremarkable.  On exam, he does have a small fissure superior to his rectum.  No hemorrhoids noted on exam.  No gross blood was seen on digital rectal exam, but the stool did appear quite dark in color. Hemoccult was negative, though we have a high suspicion for GI bleed.    Of note, he does have a history of a severe small intestinal bleed several years ago that required an exploratory laparotomy with small bowel resection.  CT angiogram personally reviewed, did not show any active bleeding but did incidentally show a hypodense lesion in the kidney and bilateral pleural effusions.  Given his acute symptomatic anemia and co-morbidities, we feel he would benefit from admission and further inpatient workup for this likely GI bleed.  We spoke with the [facility] transfer center  who stated that they do not have any beds available except at [facility], however family does not want to go there. We called the hospitalist at [facility], who has agreed to admit the pain. Consulted Dr. Lorenso Quarry with GI, who has agreed to see him as a consult.   Final Clinical Impression(s) / ED Diagnoses Final diagnoses:  Gastrointestinal hemorrhage with melena     Annett Fabian, MD 02/01/23 2240    Annett Fabian, MD 02/01/23 2245    Charlynne Pander, MD 02/02/23 1501

## 2023-02-02 ENCOUNTER — Observation Stay (HOSPITAL_COMMUNITY): Payer: Medicare Other | Admitting: Anesthesiology

## 2023-02-02 ENCOUNTER — Observation Stay (HOSPITAL_BASED_OUTPATIENT_CLINIC_OR_DEPARTMENT_OTHER): Payer: Medicare Other | Admitting: Anesthesiology

## 2023-02-02 ENCOUNTER — Encounter (HOSPITAL_COMMUNITY): Payer: Self-pay | Admitting: Family Medicine

## 2023-02-02 ENCOUNTER — Encounter (HOSPITAL_COMMUNITY)
Admission: EM | Disposition: A | Discharge status: Home / Self Care | Payer: Self-pay | Source: Home / Self Care | Attending: Emergency Medicine

## 2023-02-02 DIAGNOSIS — R933 Abnormal findings on diagnostic imaging of other parts of digestive tract: Secondary | ICD-10-CM | POA: Diagnosis not present

## 2023-02-02 DIAGNOSIS — K2970 Gastritis, unspecified, without bleeding: Secondary | ICD-10-CM | POA: Diagnosis not present

## 2023-02-02 DIAGNOSIS — Z951 Presence of aortocoronary bypass graft: Secondary | ICD-10-CM | POA: Diagnosis not present

## 2023-02-02 DIAGNOSIS — I4821 Permanent atrial fibrillation: Secondary | ICD-10-CM | POA: Diagnosis present

## 2023-02-02 DIAGNOSIS — M1990 Unspecified osteoarthritis, unspecified site: Secondary | ICD-10-CM | POA: Diagnosis not present

## 2023-02-02 DIAGNOSIS — N289 Disorder of kidney and ureter, unspecified: Secondary | ICD-10-CM

## 2023-02-02 DIAGNOSIS — I4891 Unspecified atrial fibrillation: Secondary | ICD-10-CM

## 2023-02-02 DIAGNOSIS — I5022 Chronic systolic (congestive) heart failure: Secondary | ICD-10-CM | POA: Diagnosis present

## 2023-02-02 DIAGNOSIS — D649 Anemia, unspecified: Secondary | ICD-10-CM | POA: Insufficient documentation

## 2023-02-02 DIAGNOSIS — Z7982 Long term (current) use of aspirin: Secondary | ICD-10-CM | POA: Diagnosis not present

## 2023-02-02 DIAGNOSIS — I951 Orthostatic hypotension: Secondary | ICD-10-CM | POA: Diagnosis not present

## 2023-02-02 DIAGNOSIS — K921 Melena: Secondary | ICD-10-CM | POA: Diagnosis present

## 2023-02-02 DIAGNOSIS — Z87898 Personal history of other specified conditions: Secondary | ICD-10-CM

## 2023-02-02 DIAGNOSIS — K2950 Unspecified chronic gastritis without bleeding: Secondary | ICD-10-CM | POA: Diagnosis not present

## 2023-02-02 DIAGNOSIS — L8990 Pressure ulcer of unspecified site, unspecified stage: Secondary | ICD-10-CM | POA: Insufficient documentation

## 2023-02-02 DIAGNOSIS — I2510 Atherosclerotic heart disease of native coronary artery without angina pectoris: Secondary | ICD-10-CM

## 2023-02-02 DIAGNOSIS — I130 Hypertensive heart and chronic kidney disease with heart failure and stage 1 through stage 4 chronic kidney disease, or unspecified chronic kidney disease: Secondary | ICD-10-CM | POA: Diagnosis not present

## 2023-02-02 DIAGNOSIS — I110 Hypertensive heart disease with heart failure: Secondary | ICD-10-CM | POA: Diagnosis not present

## 2023-02-02 DIAGNOSIS — Z79899 Other long term (current) drug therapy: Secondary | ICD-10-CM | POA: Diagnosis not present

## 2023-02-02 DIAGNOSIS — N182 Chronic kidney disease, stage 2 (mild): Secondary | ICD-10-CM | POA: Diagnosis not present

## 2023-02-02 DIAGNOSIS — K5711 Diverticulosis of small intestine without perforation or abscess with bleeding: Secondary | ICD-10-CM | POA: Diagnosis not present

## 2023-02-02 DIAGNOSIS — Z950 Presence of cardiac pacemaker: Secondary | ICD-10-CM | POA: Diagnosis not present

## 2023-02-02 DIAGNOSIS — Z87891 Personal history of nicotine dependence: Secondary | ICD-10-CM | POA: Diagnosis not present

## 2023-02-02 HISTORY — PX: BIOPSY: SHX5522

## 2023-02-02 HISTORY — PX: ESOPHAGOGASTRODUODENOSCOPY (EGD) WITH PROPOFOL: SHX5813

## 2023-02-02 HISTORY — DX: Unspecified atrial fibrillation: I48.91

## 2023-02-02 LAB — TYPE AND SCREEN
ABO/RH(D): O NEG
Antibody Screen: NEGATIVE

## 2023-02-02 LAB — BASIC METABOLIC PANEL
Anion gap: 9 (ref 5–15)
BUN: 19 mg/dL (ref 8–23)
CO2: 27 mmol/L (ref 22–32)
Calcium: 8.9 mg/dL (ref 8.9–10.3)
Chloride: 103 mmol/L (ref 98–111)
Creatinine, Ser: 0.78 mg/dL (ref 0.61–1.24)
GFR, Estimated: >60 mL/min (ref >60)
Glucose, Bld: 81 mg/dL (ref 70–99)
Potassium: 3.7 mmol/L (ref 3.5–5.1)
Sodium: 139 mmol/L (ref 135–145)

## 2023-02-02 LAB — CBC
HCT: 35.7 % — ABNORMAL LOW (ref 39.0–52.0)
HCT: 35.4 % — ABNORMAL LOW (ref 39.0–52.0)
HCT: 38.9 % — ABNORMAL LOW (ref 39.0–52.0)
Hemoglobin: 10.9 g/dL — ABNORMAL LOW (ref 13.0–17.0)
Hemoglobin: 11 g/dL — ABNORMAL LOW (ref 13.0–17.0)
Hemoglobin: 11.6 g/dL — ABNORMAL LOW (ref 13.0–17.0)
MCH: 27 pg (ref 26.0–34.0)
MCH: 26.8 pg (ref 26.0–34.0)
MCH: 26.6 pg (ref 26.0–34.0)
MCHC: 30.5 g/dL (ref 30.0–36.0)
MCHC: 31.1 g/dL (ref 30.0–36.0)
MCHC: 29.8 g/dL — ABNORMAL LOW (ref 30.0–36.0)
MCV: 88.6 fL (ref 80.0–100.0)
MCV: 86.3 fL (ref 80.0–100.0)
MCV: 89.2 fL (ref 80.0–100.0)
Platelets: 134 10*3/uL — ABNORMAL LOW (ref 150–400)
Platelets: 146 10*3/uL — ABNORMAL LOW (ref 150–400)
Platelets: 148 10*3/uL — ABNORMAL LOW (ref 150–400)
RBC: 4.03 MIL/uL — ABNORMAL LOW (ref 4.22–5.81)
RBC: 4.1 MIL/uL — ABNORMAL LOW (ref 4.22–5.81)
RBC: 4.36 MIL/uL (ref 4.22–5.81)
RDW: 19.6 % — ABNORMAL HIGH (ref 11.5–15.5)
RDW: 19.9 % — ABNORMAL HIGH (ref 11.5–15.5)
RDW: 19.8 % — ABNORMAL HIGH (ref 11.5–15.5)
WBC: 5.2 10*3/uL (ref 4.0–10.5)
WBC: 4.2 10*3/uL (ref 4.0–10.5)
WBC: 4.3 10*3/uL (ref 4.0–10.5)
nRBC: 0 % (ref 0.0–0.2)
nRBC: 0 % (ref 0.0–0.2)
nRBC: 0 % (ref 0.0–0.2)

## 2023-02-02 LAB — ABO/RH: ABO/RH(D): O NEG

## 2023-02-02 SURGERY — ESOPHAGOGASTRODUODENOSCOPY (EGD) WITH PROPOFOL
Anesthesia: Monitor Anesthesia Care

## 2023-02-02 MED ORDER — ROSUVASTATIN CALCIUM 20 MG PO TABS
20.0000 mg | ORAL_TABLET | Freq: Every day | ORAL | Status: DC
Start: 2023-02-03 — End: 2023-02-03
  Administered 2023-02-03: 20 mg via ORAL
  Filled 2023-02-02: qty 1

## 2023-02-02 MED ORDER — ACETAMINOPHEN 325 MG PO TABS
650.0000 mg | ORAL_TABLET | Freq: Four times a day (QID) | ORAL | Status: DC | PRN
  Administered 2023-02-02 – 2023-02-03 (×3): 650 mg via ORAL
  Filled 2023-02-02 (×3): qty 2

## 2023-02-02 MED ORDER — ACETAMINOPHEN 650 MG RE SUPP: 650.0000 mg | Freq: Four times a day (QID) | RECTAL | Status: DC | PRN

## 2023-02-02 MED ORDER — ORAL CARE MOUTH RINSE: 15.0000 mL | OROMUCOSAL | Status: DC | PRN

## 2023-02-02 MED ORDER — SODIUM CHLORIDE 0.9 % IV SOLN: INTRAVENOUS | Status: DC | PRN

## 2023-02-02 MED ORDER — PANTOPRAZOLE SODIUM 40 MG PO TBEC
40.0000 mg | DELAYED_RELEASE_TABLET | Freq: Two times a day (BID) | ORAL | Status: DC
  Administered 2023-02-02 – 2023-02-03 (×2): 40 mg via ORAL
  Filled 2023-02-02 (×2): qty 1

## 2023-02-02 MED ORDER — ASPIRIN 81 MG PO TBEC
81.0000 mg | DELAYED_RELEASE_TABLET | Freq: Every day | ORAL | Status: DC
  Administered 2023-02-03: 81 mg via ORAL
  Filled 2023-02-02: qty 1

## 2023-02-02 MED ORDER — SODIUM CHLORIDE 0.9% FLUSH
3.0000 mL | Freq: Two times a day (BID) | INTRAVENOUS | Status: DC
Start: 2023-02-02 — End: 2023-02-03
  Administered 2023-02-02 – 2023-02-03 (×2): 3 mL via INTRAVENOUS

## 2023-02-02 MED ORDER — PANTOPRAZOLE SODIUM 40 MG IV SOLR
40.0000 mg | Freq: Two times a day (BID) | INTRAVENOUS | Status: DC
  Administered 2023-02-02: 40 mg via INTRAVENOUS
  Filled 2023-02-02: qty 10

## 2023-02-02 MED ORDER — SODIUM CHLORIDE 0.9 % IV SOLN: INTRAVENOUS | Status: AC

## 2023-02-02 MED ORDER — PROPOFOL 500 MG/50ML IV EMUL
INTRAVENOUS | Status: DC | PRN
  Administered 2023-02-02: 20 mg via INTRAVENOUS
  Administered 2023-02-02: 80 ug/kg/min via INTRAVENOUS
  Administered 2023-02-02: 20 mg via INTRAVENOUS

## 2023-02-02 MED ORDER — GLYCOPYRROLATE 0.2 MG/ML IJ SOLN
INTRAMUSCULAR | Status: DC | PRN
  Administered 2023-02-02: .1 mg via INTRAVENOUS

## 2023-02-02 MED ORDER — ORAL CARE MOUTH RINSE
15.0000 mL | OROMUCOSAL | Status: DC
Start: 2023-02-02 — End: 2023-02-03
  Administered 2023-02-02 – 2023-02-03 (×4): 15 mL via OROMUCOSAL

## 2023-02-02 MED ORDER — FINASTERIDE 5 MG PO TABS
5.0000 mg | ORAL_TABLET | Freq: Every day | ORAL | Status: DC
Start: 2023-02-02 — End: 2023-02-03
  Administered 2023-02-02 – 2023-02-03 (×2): 5 mg via ORAL
  Filled 2023-02-02 (×2): qty 1

## 2023-02-02 MED ORDER — LIDOCAINE HCL (CARDIAC) PF 50 MG/5ML IV SOSY
PREFILLED_SYRINGE | INTRAVENOUS | Status: DC | PRN
  Administered 2023-02-02: 60 mg via INTRAVENOUS

## 2023-02-02 MED ORDER — NORTRIPTYLINE HCL 10 MG PO CAPS
10.0000 mg | ORAL_CAPSULE | Freq: Every day | ORAL | Status: DC
  Administered 2023-02-02: 10 mg via ORAL
  Filled 2023-02-02 (×2): qty 1

## 2023-02-02 MED ORDER — PROPOFOL 1000 MG/100ML IV EMUL
INTRAVENOUS | Status: AC
Start: 2023-02-02 — End: ?
  Filled 2023-02-02: qty 100

## 2023-02-02 SURGICAL SUPPLY — 14 items

## 2023-02-02 NOTE — Progress Notes (Signed)
Pt moving to [id number]. Pt made aware and all question answered. Report given to [facility].

## 2023-02-02 NOTE — Op Note (Signed)
[facility] Patient Name: Rodney Ross Procedure Date: 02/02/2023 MRN: [id number] Attending MD: Kathi Der , MD, [id number] Date of Birth: 1934/06/22 CSN: [id number] Age: 89 Admit Type: Inpatient Procedure:                Upper GI endoscopy Indications:              Melena, Abnormal CT of the GI tract Providers:                Kathi Der, MD, Zoe Lan, RN, Alan Ripper,                            Technician Referring MD:              Medicines:                Sedation Administered by an Anesthesia Professional Complications:            No immediate complications. Estimated Blood Loss:     Estimated blood loss was minimal. Procedure:                Pre-Anesthesia Assessment:                           - Prior to the procedure, a History and Physical                            was performed, and patient medications and                            allergies were reviewed. The patient's tolerance of                            previous anesthesia was also reviewed. The risks                            and benefits of the procedure and the sedation                            options and risks were discussed with the patient.                            All questions were answered, and informed consent                            was obtained. Prior Anticoagulants: The patient has                            taken no anticoagulant or antiplatelet agents                            except for aspirin. ASA Grade Assessment: III - A                            patient with severe systemic disease. After  reviewing the risks and benefits, the patient was                            deemed in satisfactory condition to undergo the                            procedure.                           After obtaining informed consent, the endoscope was                            passed under direct vision. Throughout the                             procedure, the patient's blood pressure, pulse, and                            oxygen saturations were monitored continuously. The                            [id number]-[id number] ([id number]) Olympus endoscope was introduced                            through the mouth, and advanced to the second part                            of duodenum. The upper GI endoscopy was                            accomplished without difficulty. The patient                            tolerated the procedure well. Scope In: Scope Out: Findings:      The Z-line was regular and was found 38 cm from the incisors.      Diffuse moderate inflammation characterized by congestion (edema),       erosions, erythema and friability was found in the entire examined       stomach. Biopsies were taken with a cold forceps for histology.      The cardia and gastric fundus were normal on retroflexion.      The duodenal bulb and first portion of the duodenum were normal.      A large diverticulum was found in the second portion of the duodenum. Impression:               - Z-line regular, 38 cm from the incisors.                           - Chronic gastritis. Biopsied.                           - Normal duodenal bulb and first portion of the                            duodenum.                           -  Duodenal diverticulum. Moderate Sedation:      Moderate (conscious) sedation was personally administered by an       anesthesia professional. The following parameters were monitored: oxygen       saturation, heart rate, blood pressure, and response to care. Recommendation:           - Return patient to hospital ward for ongoing care.                           - Resume previous diet.                           - Continue present medications.                           - Await pathology results. Procedure Code(s):        --- Professional ---                           [phone], Esophagogastroduodenoscopy, flexible,                             transoral; with biopsy, single or multiple Diagnosis Code(s):        --- Professional ---                           K29.50, Unspecified chronic gastritis without                            bleeding                           K92.1, Melena (includes Hematochezia)                           K57.10, Diverticulosis of small intestine without                            perforation or abscess without bleeding                           R93.3, Abnormal findings on diagnostic imaging of                            other parts of digestive tract CPT copyright 2022 American Medical Association. All rights reserved. The codes documented in this report are preliminary and upon coder review may  be revised to meet current compliance requirements. Kathi Der, MD Kathi Der, MD 02/02/2023 1:34:25 PM Number of Addenda: 0

## 2023-02-02 NOTE — Progress Notes (Signed)
  Progress Note   Patient: Rodney Ross ZOX:[id number] DOB: 1934-08-29 DOA: 02/01/2023     0 DOS: the patient was seen and examined on 02/02/2023        Brief hospital course: 89 y.o. M with hx small bowel hemorrhage requiring resection, CAD s/p CABG, AF not on AC, hx Watchman implant, HFrEF EF 35-40%, and hx bradycardia s/p PPM who presented with melena.     Assessment and Plan: * Melena One episode, no recurrences since.   EGD today showed mild gastritis only.  GI have signed off - Transition back to home oral Protonix    Orthostatic hypotension Patient with BP 130s laying, 110s sitting and 90s standing, with symptoms, feels weak and dizzy - Hold furosemide and Coreg for now - Start IV fluids - Start ted hose and Binder    History of bradycardia s/p pacemaker    Pressure injury of skin Medial buttocks, stage II, present on admission  Normocytic anemia Hemoglobin remained stable overnight - Trend hemoglobin  Kidney lesion, native, right - Outpatient follow-up  Chronic HFrEF (heart failure with reduced ejection fraction) (HCC) Appears euvolemic to dehydrated - Hold furosemide and carvedilol for today  Permanent atrial fibrillation (HCC) Not on anticoagulation, has Watchman - Hold carvedilol for today  Coronary artery disease - Resume aspirin, Crestor -Hold carvedilol          Subjective: No confusion, no more bleeding.  No fever no vomiting.  He is dizzy and weak with standing.     Physical Exam: BP 137/75 (BP Location: Left Arm)   Pulse 74   Temp (!) 97.5 F (36.4 C) (Oral)   Resp 17   Ht 5\' 5"  (1.651 m)   Wt 57.2 kg   SpO2 96%   BMI 20.97 kg/m   Thin adult male, lying in bed, no acute distress RRR no mrumurs no LE edema Respiratory rate normal, lungs clear Abdomen soft and benign no TTP Attention normal, affect pleasant, judgment and insight normal, moves all extremities with generalized weaknes but symmetric strength  Data  Reviewed: CBC shows stable hemoglobin Basic metabolic panel unremarkable CTA chest shows no bleed, there are some small pleural effusions but no pneumonia symptoms at all    Family Communication: None      Disposition: The patient is quite orthostatic and symptomatic, and will require IV fluids prior to discharge        Author: Alberteen Sam, MD 02/02/2023 3:39 PM  For on call review [email or web address].

## 2023-02-02 NOTE — Assessment & Plan Note (Signed)
-   Resume aspirin, Crestor -Hold carvedilol

## 2023-02-02 NOTE — Assessment & Plan Note (Signed)
Appears euvolemic to dehydrated - Hold furosemide and carvedilol for today

## 2023-02-02 NOTE — Anesthesia Procedure Notes (Signed)
Procedure Name: General with mask airway Date/Time: 02/02/2023 1:28 PM  Performed by: Floydene Flock, CRNAPre-anesthesia Checklist: Patient identified, Emergency Drugs available, Suction available, Patient being monitored and Timeout performed Patient Re-evaluated:Patient Re-evaluated prior to induction Oxygen Delivery Method: Simple face mask Preoxygenation: Pre-oxygenation with 100% oxygen Placement Confirmation: positive ETCO2

## 2023-02-02 NOTE — Assessment & Plan Note (Signed)
 Outpatient follow-up

## 2023-02-02 NOTE — Assessment & Plan Note (Signed)
Patient with BP 130s laying, 110s sitting and 90s standing, with symptoms, feels weak and dizzy - Hold furosemide and Coreg for now - Start IV fluids - Start ted hose and [employer]

## 2023-02-02 NOTE — H&P (Addendum)
History and Physical    Rodney Ross HYQ:[id number] DOB: 07-27-1934 DOA: 02/01/2023  PCP: [facility]   Patient coming from: ILF  Chief Complaint: Dark stool  HPI: Rodney Ross is a 89 y.o. 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  Patient reports roughly 1 month of lightheadedness upon standing, 1 week of abdominal cramping, and then had a dark stool today.  Patient reports that he was in his usual state when he was out with his caregiver, had a bowel movement, and it was noted to be very dark.  Patient denies any abdominal pain, vomiting, or seeing any red blood with his bowel movements.  He denies chest pain, shortness of breath, fever, or chills.  [facility] ED Course: Upon arrival to the ED, patient is found to be afebrile and saturating well on room air with stable blood pressure.  Labs are most notable for normal BUN, normal WBC, hemoglobin of 11.3, and negative FOBT.  GI (Dr. Lorenso Quarry) was consulted by the ED physician, patient was given 80 mg IV Protonix, and he was transferred to [facility] for admission.  Review of Systems:  All other systems reviewed and apart from HPI, are negative.  Past Medical History:  Diagnosis Date   AAA (abdominal aortic aneurysm) (HCC)    Arthritis    Atrial fibrillation (HCC) 02/02/2023   B12 deficiency    BPH with elevated PSA, without lower urinary tract symptoms    CKD (chronic kidney disease) stage 2, GFR 60-89 ml/min    Coronary artery disease    DDD (degenerative disc disease), lumbar    GERD (gastroesophageal reflux disease)    Hyperlipidemia    Hypertension    IBS (irritable bowel syndrome)    Interstitial lung disease (HCC)    Lumbar radiculopathy    Lumbar  spondylosis    MI (myocardial infarction) (HCC)    Neuropathy    Osteoarthritis    Vitamin D deficiency     Past Surgical History:  Procedure Laterality Date   CORONARY ARTERY BYPASS GRAFT     IR RADIOLOGIST EVAL & MGMT  07/04/2018   IR RADIOLOGIST EVAL & MGMT  06/13/2019   IR RADIOLOGIST EVAL & MGMT  11/27/2019   IR RADIOLOGIST EVAL & MGMT  08/20/2020   IR RADIOLOGIST EVAL & MGMT  05/21/2021   PACEMAKER IMPLANT      Social History:   reports that he has quit smoking. His smoking use included cigarettes. He has never used smokeless tobacco. He reports current alcohol use. He reports that he does not use drugs.  Allergies  Allergen Reactions   Meloxicam Other (See Comments)    Congestive Heart Failure Exacerbation Syncopal Episode while driving   Atorvastatin Other (See Comments)   Niacin     Other reaction(s): Flushing (ALLERGY/intolerance)   Shellfish Allergy Other (See Comments)    flushing    History reviewed. No pertinent family history.   Prior to Admission medications   Medication Sig Start Date End Date Taking? Authorizing Provider  acetaminophen (TYLENOL) 650 MG CR tablet Take 650 mg by mouth 2 (two) times daily as needed for pain.   Yes [provider]  alendronate (FOSAMAX) 70 MG tablet Take 70 mg by mouth once a week. Take with a full glass of water on an empty  stomach.   Yes [provider]  aspirin EC 81 MG tablet Take 81 mg by mouth daily. Swallow whole.   Yes [provider]  benzonatate (TESSALON) 200 MG capsule Take 200 mg by mouth 3 (three) times daily as needed for cough.   Yes [provider]  carvedilol (COREG) 3.125 MG tablet Take 3.125 mg by mouth 2 (two) times daily with a meal. Check BP and HR Hold if HR is < 60 Hold if sys is < 100   Yes [provider]  dicyclomine (BENTYL) 10 MG capsule Take 10 mg by mouth 2 (two) times daily as needed for spasms.   Yes [provider]  finasteride  (PROSCAR) 5 MG tablet Take 5 mg by mouth at bedtime.   Yes [provider]  furosemide (LASIX) 20 MG tablet Take 20 mg by mouth daily.   Yes [provider]  gabapentin (NEURONTIN) 300 MG capsule Take 300 mg by mouth 2 (two) times daily.   Yes [provider]  guaifenesin (ROBITUSSIN) 100 MG/5ML syrup Take 200 mg by mouth 3 (three) times daily as needed for cough.   Yes [provider]  magnesium oxide (MAG-OX) 400 (240 Mg) MG tablet Take 400 mg by mouth 2 (two) times daily.   Yes [provider]  nitroGLYCERIN (NITROSTAT) 0.4 MG SL tablet Place 0.4 mg under the tongue every 5 (five) minutes as needed for chest pain (x 2 doses).   Yes [provider]  nortriptyline (PAMELOR) 10 MG capsule Take 10 mg by mouth at bedtime.   Yes [provider]  pantoprazole (PROTONIX) 40 MG tablet Take 40 mg by mouth 2 (two) times daily.   Yes [provider]  polyethylene glycol (MIRALAX / GLYCOLAX) 17 g packet Take 17 g by mouth daily.   Yes [provider]  potassium chloride (MICRO-K) 10 MEQ CR capsule Take 10 mEq by mouth daily.   Yes [provider]  rosuvastatin (CRESTOR) 20 MG tablet Take 20 mg by mouth at bedtime.   Yes [provider]    Physical Exam: Vitals:   02/01/23 2115 02/02/23 0000 02/02/23 0205 02/02/23 0346  BP: 132/64 (!) 128/57 (!) 113/57 133/73  Pulse: 77 79 76 72  Resp: 18 17 19 20   Temp:   97.8 F (36.6 C) 97.9 F (36.6 C)  TempSrc:    Oral  SpO2: 96% 95% 92% 100%  Weight:      Height:        Constitutional: NAD, calm  Eyes: PERTLA, lids and conjunctivae normal ENMT: Mucous membranes are moist. Posterior pharynx clear of any exudate or lesions.   Neck: supple, no masses  Respiratory: no wheezing, no crackles. No accessory muscle use.  Cardiovascular: S1 & S2 heard, regular rate and rhythm. No extremity edema.   Abdomen: No distension, no tenderness, soft. Bowel sounds active.   Musculoskeletal: no clubbing / cyanosis. No joint deformity upper and lower extremities.   Skin: no significant rashes, lesions, ulcers. Warm, dry, well-perfused. Neurologic: CN 2-12 grossly intact. Moving all extremities. Alert and oriented.  Psychiatric: Pleasant. Cooperative.    Labs and Imaging on Admission: I have personally reviewed following labs and imaging studies  CBC: Recent Labs  Lab 02/01/23 1536  WBC 6.8  HGB 11.3*  HCT 36.4*  MCV 86.1  PLT 139*   Basic Metabolic Panel: Recent Labs  Lab 02/01/23 1536  NA 136  K 4.4  CL 99  CO2 30  GLUCOSE 100*  BUN 23  CREATININE 0.97  CALCIUM 9.0   GFR: Estimated Creatinine Clearance: 44.2 mL/min (by C-G formula based on SCr of 0.97 mg/dL). Liver Function Tests: Recent Labs  Lab 02/01/23 1536  AST 12*  ALT 10  ALKPHOS 77  BILITOT 0.9  PROT 6.7  ALBUMIN 3.6   No results for input(s): "LIPASE", "AMYLASE" in the last 168 hours. No results for input(s): "AMMONIA" in the last 168 hours. Coagulation Profile: No results for input(s): "INR", "PROTIME" in the last 168 hours. Cardiac Enzymes: No results for input(s): "CKTOTAL", "CKMB", "CKMBINDEX", "TROPONINI" in the last 168 hours. BNP (last 3 results) No results for input(s): "PROBNP" in the last 8760 hours. HbA1C: No results for input(s): "HGBA1C" in the last 72 hours. CBG: No results for input(s): "GLUCAP" in the last 168 hours. Lipid Profile: No results for input(s): "CHOL", "HDL", "LDLCALC", "TRIG", "CHOLHDL", "LDLDIRECT" in the last 72 hours. Thyroid Function Tests: No results for input(s): "TSH", "T4TOTAL", "FREET4", "T3FREE", "THYROIDAB" in the last 72 hours. Anemia Panel: No results for input(s): "VITAMINB12", "FOLATE", "FERRITIN", "TIBC", "IRON", "RETICCTPCT" in the last 72 hours. Urine analysis: No results found for: "COLORURINE", "APPEARANCEUR", "LABSPEC", "PHURINE", "GLUCOSEU", "HGBUR", "BILIRUBINUR", "KETONESUR", "PROTEINUR", "UROBILINOGEN",  "NITRITE", "LEUKOCYTESUR" Sepsis Labs: @LABRCNTIP (procalcitonin:4,lacticidven:4) )No results found for this or any previous visit (from the past 240 hours).   Radiological Exams on Admission: CT Angio Abd/Pel W and/or Wo Contrast Result Date: 02/01/2023 CLINICAL DATA:  Black tarry stool.  Concern for GI bleed. EXAM: CTA ABDOMEN AND PELVIS WITHOUT AND WITH CONTRAST TECHNIQUE: Multidetector CT imaging of the abdomen and pelvis was performed using the standard protocol during bolus administration of intravenous contrast. Multiplanar reconstructed images and MIPs were obtained and reviewed to evaluate the vascular anatomy. RADIATION DOSE REDUCTION: This exam was performed according to the departmental dose-optimization program which includes automated exposure control, adjustment of the mA and/or kV according to patient size and/or use of iterative reconstruction technique. CONTRAST:  OMNIPAQUE IOHEXOL 350 MG/ML SOLN COMPARISON:  Abdominal CT dated 05/23/2022. FINDINGS: Evaluation of this exam is limited due to respiratory motion. VASCULAR Aorta: Advanced atherosclerotic calcification of the abdominal aorta. There is a 3.4 cm fusiform infrarenal abdominal aortic aneurysm. No aortic dissection. No periaortic fluid collection. Celiac: Atherosclerotic calcification of the origin of the celiac trunk. The celiac artery remains patent. SMA: Advanced atherosclerotic calcification of the origin of the SMA. The SMA remains patent. Renals: Advanced atherosclerotic calcification of the ostia of the renal arteries. The renal arteries are patent. IMA: The IMA is poorly opacified. Inflow: Advanced atherosclerotic calcification of the iliac arteries. The iliac arteries remain patent. No dissection. Proximal Outflow: Advanced atherosclerotic calcification. The visualized proximal outflow appears patent. Veins: The IVC is unremarkable. The SMV, splenic vein, and main portal vein are patent. No portal venous gas. Review of  the MIP images confirms the above findings. NON-VASCULAR Lower chest: Partially visualized bilateral pleural effusions, right greater than left with associated compressive atelectasis of the adjacent lungs. Pneumonia is not excluded. There is advanced 3 vessel coronary vascular calcification and postsurgical changes of CABG. Cardiac pacemaker noted. No intra-abdominal free air.  No significant free fluid. Hepatobiliary: Heterogeneous enhancement of the liver. No biliary dilatation. Cholecystectomy. Pancreas: Unremarkable. No pancreatic ductal dilatation or surrounding inflammatory changes. Spleen: Normal in size without focal abnormality. Adrenals/Urinary Tract: The adrenal glands unremarkable. There is no hydronephrosis or nephrolithiasis on either side. Renal vascular calcifications noted. 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 This is not characterized on today's exam due to small size and suboptimal visualization. However, there appears to be enhancement on postcontrast images. The urinary bladder is grossly unremarkable. Stomach/Bowel: Evaluation for GI bleed is limited due to presence of high density stool throughout the colon likely mixed with prior oral contrast. There is enhancement of the gastric wall which may be physiologic. Gastritis is less likely. There is severe colonic diverticulosis. There is no bowel obstruction or active inflammation. No evidence of active GI bleed. The appendix is not visualized with certainty. No inflammatory changes identified in the right lower quadrant. Lymphatic: No adenopathy. Reproductive: The prostate is grossly unremarkable. Other: Mild diffuse subcutaneous edema. Musculoskeletal: Osteopenia with degenerative changes. No acute osseous pathology. IMPRESSION: 1. No evidence of active GI bleed. 2. Severe colonic diverticulosis. No bowel obstruction. 3. Partially visualized  bilateral pleural effusions, right greater than left with associated compressive atelectasis of the adjacent lungs. Pneumonia is not excluded. 4. 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 5.  Aortic Atherosclerosis (ICD10-I70.0). Electronically Signed   By: Elgie Collard M.D.   On: 02/01/2023 19:08    Assessment/Plan   1. Melena; anemia  - Initial Hgb is 11.3 (11.5 on January 9th), FOBT is negative, BUN is normal, and no acute findings noted on CTA abdomen/pelvis - Continue NPO, hold ASA, continue IV PPI for now, follow serial CBCs, follow-up on GI recommendations    2. Chronic HFrEF  - Appears compensated  - Monitor weight and I/Os    3. Atrial fibrillation  - S/p Watchman implant in 2021  - Hold ASA for now    4. CAD  - Hx of CABG in 2005  - No anginal complaints  - Hold ASA for now    5. Right renal lesion  - Noted on CT in ED  - Discussed with patient, outpatient follow-up recommended     DVT prophylaxis: SCDs  Code Status: DNR  Level of Care: Level of care: Telemetry Family Communication: none present  Disposition Plan:  Patient is from: ILF  Anticipated d/c is to: ILF  Anticipated d/c date is: 1/23 or 02/03/23  Patient currently: Pending GI consultation, stable H&H  Consults called: GI  Admission status: Observation     Briscoe Deutscher, MD [facility]  02/02/2023, 4:40 AM

## 2023-02-02 NOTE — Brief Op Note (Signed)
02/02/2023  1:34 PM  PATIENT:  Rodney Ross  89 y.o. male  PRE-OPERATIVE DIAGNOSIS:  Melena  POST-OPERATIVE DIAGNOSIS:  gastritis, gastritis  PROCEDURE:  Procedure(s): ESOPHAGOGASTRODUODENOSCOPY (EGD) WITH PROPOFOL (N/A) BIOPSY  SURGEON:  Surgeons and Role:    * Terius Jacuinde, Darcus Austin, MD - Primary  Findings ---------- -EGD showed moderate gastritis.  Diverticulum in the second portion of the duodenum.  No active bleeding.  Recommendations ------------------------ -Recommend Protonix 40 mg once a day at least for 8 weeks -Avoid NSAIDs -Advance diet as tolerated -No further inpatient GI workup planned.  GI will sign off.  Call us back if needed.  [facility].  Kathi Der MD, FACP 02/02/2023, 1:35 PM  Contact #  [phone]

## 2023-02-02 NOTE — Transfer of Care (Signed)
Immediate Anesthesia Transfer of Care Note  Patient: Rodney Ross  Procedure(s) Performed: ESOPHAGOGASTRODUODENOSCOPY (EGD) WITH PROPOFOL BIOPSY  Patient Location: PACU and Endoscopy Unit  Anesthesia Type:MAC  Level of Consciousness: drowsy  Airway & Oxygen Therapy: Patient Spontanous Breathing and Patient connected to face mask oxygen  Post-op Assessment: Report given to RN and Post -op Vital signs reviewed and stable  Post vital signs: Reviewed and stable  Last Vitals:  Vitals Value Taken Time  BP 116/54 02/02/23 1350  Temp 36.3 C 02/02/23 1340  Pulse 75 02/02/23 1352  Resp 18 02/02/23 1352  SpO2 94 % 02/02/23 1352  Vitals shown include unfiled device data.  Last Pain:  Vitals:   02/02/23 1340  TempSrc: Temporal  PainSc: 0-No pain         Complications: No notable events documented.

## 2023-02-02 NOTE — Evaluation (Signed)
Physical Therapy Evaluation Patient Details Name: Rodney Ross MRN: [id number] DOB: October 08, 1934 Today's Date: 02/02/2023  History of Present Illness  89 y.o. male presented with melena.  Also found to have orthostatic hypotension.  Past medical history significant for CAD status post CABG, atrial fibrillation with Watchman device, severe small bowel hemorrhage status post bowel resection, chronic HFrEF, and symptomatic bradycardia with pacemaker  Clinical Impression  Pt admitted with above diagnosis.  Pt currently with functional limitations due to the deficits listed below (see PT Problem List). Pt will benefit from acute skilled PT to increase their independence and safety with mobility to allow discharge.   Pt reporting he wished to d/c home and agreeable to mobilize. MD into room and discussed drop in BP with orthostatics (performed by RN earlier) and okay with pt mobilizing but monitoring symptoms.  Pt then seemed more agreeable to d/c tomorrow but still wanted to see how he would tolerate mobility at this time so recliner following for safety.  Pt reports he has been having dizziness with sitting and standing for at least a month.  Pt states he is from ILF and able to ambulate to dining hall with assistive device.  Pt would benefit from HHPT upon d/c.         If plan is discharge home, recommend the following: A little help with walking and/or transfers;Help with stairs or ramp for entrance;Assistance with cooking/housework   Can travel by private vehicle        Equipment Recommendations None recommended by PT  Recommendations for Other Services       Functional Status Assessment Patient has had a recent decline in their functional status and demonstrates the ability to make significant improvements in function in a reasonable and predictable amount of time.     Precautions / Restrictions Precautions Precautions: Fall Precaution Comments: orthostatic, hx quad tendon tear and wears  brace (locked out at 90*)      Mobility  Bed Mobility Overal bed mobility: Needs Assistance Bed Mobility: Supine to Sit     Supine to sit: Supervision          Transfers Overall transfer level: Needs assistance Equipment used: Rolling walker (2 wheels) Transfers: Sit to/from Stand Sit to Stand: Contact guard assist           General transfer comment: cues for hand placement    Ambulation/Gait Ambulation/Gait assistance: Contact guard assist, +2 safety/equipment Gait Distance (Feet): 60 Feet Assistive device: Rolling walker (2 wheels) Gait Pattern/deviations: Step-through pattern, Decreased stride length, Trunk flexed Gait velocity: decr     General Gait Details: pt reports mild dizziness but did not worsen, recliner following for safety due to orthostatic hypotension, pt aware to request to sit down if feeling poorly and he did not feel he needed but states "If I was at home right now, I wouldn't be doing this"  Stairs            Wheelchair Mobility     Tilt Bed    Modified Rankin (Stroke Patients Only)       Balance Overall balance assessment: Mild deficits observed, not formally tested                                           Pertinent Vitals/Pain Pain Assessment Pain Assessment: No/denies pain    Home Living Family/patient expects to be discharged to:: Private residence  Living Arrangements: Spouse/significant other   Type of Home: Independent living facility           Home Equipment: Rolling Walker (2 wheels);Scientist, research (medical) (4 wheels)      Prior Function Prior Level of Function : Independent/Modified Independent             Mobility Comments: reports he is ambulatory with assistive device to dining hall, states he stays active       Extremity/Trunk Assessment        Lower Extremity Assessment Lower Extremity Assessment: Generalized weakness       Communication    Communication Communication: No apparent difficulties  Cognition Arousal: Alert Behavior During Therapy: WFL for tasks assessed/performed Overall Cognitive Status: Within Functional Limits for tasks assessed                                          General Comments      Exercises     Assessment/Plan    PT Assessment Patient needs continued PT services  PT Problem List Decreased strength;Decreased activity tolerance;Decreased balance;Cardiopulmonary status limiting activity;Decreased knowledge of use of DME       PT Treatment Interventions Gait training;DME instruction;Balance training;Functional mobility training;Therapeutic activities;Therapeutic exercise;Patient/family education    PT Goals (Current goals can be found in the Care Plan section)  Acute Rehab PT Goals PT Goal Formulation: With patient Time For Goal Achievement: 02/16/23 Potential to Achieve Goals: Good    Frequency Min 1X/week     Co-evaluation               AM-PAC PT "6 Clicks" Mobility  Outcome Measure Help needed turning from your back to your side while in a flat bed without using bedrails?: A Little Help needed moving from lying on your back to sitting on the side of a flat bed without using bedrails?: A Little Help needed moving to and from a bed to a chair (including a wheelchair)?: A Little Help needed standing up from a chair using your arms (e.g., wheelchair or bedside chair)?: A Little Help needed to walk in hospital room?: A Little Help needed climbing 3-5 steps with a railing? : A Little 6 Click Score: 18    End of Session Equipment Utilized During Treatment: Gait belt Activity Tolerance: Patient tolerated treatment well Patient left: in chair;with call bell/phone within reach;with chair alarm set Nurse Communication: Mobility status PT Visit Diagnosis: Difficulty in walking, not elsewhere classified (R26.2)    Time: 1610-9604 PT Time Calculation (min) (ACUTE  ONLY): 22 min   Charges:   PT Evaluation $PT Eval Low Complexity: 1 Low   PT General Charges $$ ACUTE PT VISIT: 1 Visit        Thomasene Mohair PT, DPT Physical Therapist Acute Rehabilitation Services Office: [phone]  Janan Halter [place] 02/02/2023, 4:12 PM

## 2023-02-02 NOTE — Assessment & Plan Note (Signed)
Hemoglobin remained stable overnight - Trend hemoglobin

## 2023-02-02 NOTE — Plan of Care (Signed)
  Problem: Education: Goal: Knowledge of General Education information will improve Description: Including pain rating scale, medication(s)/side effects and non-pharmacologic comfort measures Outcome: Progressing   Problem: Clinical Measurements: Goal: Respiratory complications will improve Outcome: Progressing Goal: Cardiovascular complication will be avoided Outcome: Progressing   Problem: Pain Managment: Goal: General experience of comfort will improve and/or be controlled Outcome: Progressing   Problem: Safety: Goal: Ability to remain free from injury will improve Outcome: Progressing

## 2023-02-02 NOTE — Assessment & Plan Note (Signed)
Medial buttocks, stage II, present on admission

## 2023-02-02 NOTE — Assessment & Plan Note (Signed)
Not on anticoagulation, has Watchman - Hold carvedilol for today

## 2023-02-02 NOTE — ED Notes (Signed)
[facility] called at 02:00a

## 2023-02-02 NOTE — Hospital Course (Signed)
89 y.o. M with hx small bowel hemorrhage requiring resection, CAD s/p CABG, AF not on AC, hx Watchman implant, HFrEF EF 35-40%, and hx bradycardia s/p PPM who presented with melena.

## 2023-02-02 NOTE — Assessment & Plan Note (Signed)
One episode, no recurrences since.   EGD today showed mild gastritis only.  GI have signed off - Transition back to home oral Protonix

## 2023-02-02 NOTE — Anesthesia Preprocedure Evaluation (Signed)
Anesthesia Evaluation  Patient identified by MRN, date of birth, ID band Patient awake    Reviewed: Allergy & Precautions, NPO status , Patient's Chart, lab work & pertinent test results  Airway Mallampati: II  TM Distance: >3 FB Neck ROM: Full    Dental   Pulmonary former smoker   breath sounds clear to auscultation       Cardiovascular hypertension, Pt. on medications + CAD, + Past MI, + CABG and + Peripheral Vascular Disease  + dysrhythmias Atrial Fibrillation  Rhythm:Regular Rate:Normal     Neuro/Psych  Neuromuscular disease    GI/Hepatic Neg liver ROS,GERD  ,,  Endo/Other  negative endocrine ROS    Renal/GU CRFRenal disease     Musculoskeletal   Abdominal   Peds  Hematology  (+) Blood dyscrasia, anemia   Anesthesia Other Findings   Reproductive/Obstetrics                             Anesthesia Physical Anesthesia Plan  ASA: 3  Anesthesia Plan: MAC   Post-op Pain Management:    Induction:   PONV Risk Score and Plan: 1 and Propofol infusion  Airway Management Planned: Natural Airway and Nasal Cannula  Additional Equipment:   Intra-op Plan:   Post-operative Plan:   Informed Consent: I have reviewed the patients History and Physical, chart, labs and discussed the procedure including the risks, benefits and alternatives for the proposed anesthesia with the patient or authorized representative who has indicated his/her understanding and acceptance.       Plan Discussed with:   Anesthesia Plan Comments:        Anesthesia Quick Evaluation

## 2023-02-02 NOTE — Progress Notes (Signed)
Hospitalist Transfer Note:    Nursing staff, Please call [facility] Admits & Consults System-Wide number on Amion [phone]) as soon as patient's arrival, so appropriate admitting provider can evaluate the pt.   Transferring facility: [facility] Requesting provider: Dr. Silverio Lay (EDP at [facility]) Reason for transfer: admission for further evaluation and management of suspected acute upper GI bleed.    33 M w/ h/o CAD s/p CABG, paroxysmal atrial fibrillation on aspirin alone in the setting of a history of GI bleed in 2021, who presented to [facility] ED for evaluation of melena.  The patient lives at home with his daughter, who noted 2 episodes of melena over the last few days, which is new for him.  Otherwise, he is without acute complaint at this time.  He has a history of complicated GI bleed in 2021, which ultimately resulted in a partial small bowel resection with reanastomosis.  He has a history of paroxysmal atrial fibrillation, but is on daily baby aspirin alone, as opposed to formal anticoagulation, as result of this history of GI bleed in 2021.  Vital signs in the ED were notable for the following: Afebrile; heart rates in the 60s to 70s; systolic blood pressures in the 120s to 130s.  DRE was associated with fecal occult blood negative finding.  Labs were notable for CBC, which showed hemoglobin of 11.5, down from his reported baseline hemoglobin of around 13.  Imaging notable for CTA abdomen/pelvis with and without contrast showed no evidence of active contrast extravasation, and showed evidence of diverticulosis in the absence of diverticulitis.  EDP d/w on-call [facility], Dr. Lorenso Quarry, who is on-call at [facility] and conveyed that [facility] will formally consult on the patient at [facility].   Subsequently, I accepted this patient for transfer for observation to a med-tele bed at [facility] for further work-up and management of the above.     Rodney Pigg, DO Hospitalist

## 2023-02-02 NOTE — Consult Note (Signed)
Referring Provider: [facility] Primary Care Physician:  [place] Primary Gastroenterologist: Unassigned  Reason for Consultation: GI bleed  HPI: Rodney Ross is a 89 y.o. male with past medical history of bleeding from small bowel requiring small bowel resection in the past, history of bradycardia status post pacemaker placement, atrial fibrillation on aspirin, history of coronary disease status post CABG, history of GI bleed in 2021 presented to the hospital with melanotic stool.  CT angio yesterday showed no evidence of active bleeding.  Showed severe colonic diverticulosis but evaluation was limited due to presence of high density stool throughout the colon.  There was some gastric wall thickening.  Patient seen and examined at bedside.  He had 1 episode of black-colored stool yesterday.  Denies any associated abdominal pain, nausea or vomiting.  Denies any diarrhea or constipation.  Denies any fresh blood in the stool.  Denies any NSAID use.  Takes baby aspirin.   Previous GI workup at [facility] 02/24/19: - Esophagus. Z line is slightly irregular at 42 cm. NO esophagitis, stricture or ring - Gastric. No old blood. Ulcers not found - Duodenum (bulb-D2). Deep intubation of entire D2 - normal  NM GI bleed 02/25/19: Positive bleeding scan with a bleed appear to be in the proximal small bowel.   Embolization could not be done given patient agitation. Ultimately had had an ex lap with small bowel resection on 02/26/2019 (bleeding diverticulosis of jejunum).   Colonoscopy 06/2018 with tics and internal hemorrhoids.   Past Medical History:  Diagnosis Date   AAA (abdominal aortic aneurysm) (HCC)    Arthritis    Atrial fibrillation (HCC) 02/02/2023   B12 deficiency    BPH with elevated PSA, without lower urinary tract symptoms    CKD (chronic kidney disease) stage 2, GFR 60-89 ml/min    Coronary artery disease    DDD (degenerative disc disease), lumbar    GERD  (gastroesophageal reflux disease)    Hyperlipidemia    Hypertension    IBS (irritable bowel syndrome)    Interstitial lung disease (HCC)    Lumbar radiculopathy    Lumbar spondylosis    MI (myocardial infarction) (HCC)    Neuropathy    Osteoarthritis    Vitamin D deficiency     Past Surgical History:  Procedure Laterality Date   CORONARY ARTERY BYPASS GRAFT     IR RADIOLOGIST EVAL & MGMT  07/04/2018   IR RADIOLOGIST EVAL & MGMT  06/13/2019   IR RADIOLOGIST EVAL & MGMT  11/27/2019   IR RADIOLOGIST EVAL & MGMT  08/20/2020   IR RADIOLOGIST EVAL & MGMT  05/21/2021   PACEMAKER IMPLANT      Prior to Admission medications   Medication Sig Start Date End Date Taking? Authorizing Provider  acetaminophen (TYLENOL) 650 MG CR tablet Take 650 mg by mouth 2 (two) times daily as needed for pain.   Yes [provider]  alendronate (FOSAMAX) 70 MG tablet Take 70 mg by mouth once a week. Monday's   Yes [provider]  aspirin EC 81 MG tablet Take 81 mg by mouth in the morning. Swallow whole.   Yes [provider]  benzonatate (TESSALON) 200 MG capsule Take 200 mg by mouth 3 (three) times daily as needed for cough.   Yes [provider]  carvedilol (COREG) 3.125 MG tablet Take 3.125 mg by mouth 2 (two) times daily with a meal. Check BP and HR Hold if HR is < 60 Hold if sys is <  100   Yes [provider]  Cyanocobalamin (B-12 PO) Take 1 tablet by mouth in the morning.   Yes [provider]  dicyclomine (BENTYL) 10 MG capsule Take 10 mg by mouth 2 (two) times daily as needed for spasms.   Yes [provider]  finasteride (PROSCAR) 5 MG tablet Take 5 mg by mouth at bedtime.   Yes [provider]  furosemide (LASIX) 20 MG tablet Take 20 mg by mouth in the morning.   Yes [provider]  gabapentin (NEURONTIN) 300 MG capsule Take 300 mg by mouth 2 (two) times daily.   Yes [provider]  guaifenesin (ROBITUSSIN)  100 MG/5ML syrup Take 200 mg by mouth 3 (three) times daily as needed for cough.   Yes [provider]  magnesium oxide (MAG-OX) 400 (240 Mg) MG tablet Take 400 mg by mouth 2 (two) times daily.   Yes [provider]  nitroGLYCERIN (NITROSTAT) 0.4 MG SL tablet Place 0.4 mg under the tongue every 5 (five) minutes as needed for chest pain (x 2 doses).   Yes [provider]  nortriptyline (PAMELOR) 10 MG capsule Take 10 mg by mouth at bedtime.   Yes [provider]  pantoprazole (PROTONIX) 40 MG tablet Take 40 mg by mouth 2 (two) times daily.   Yes [provider]  polyethylene glycol (MIRALAX / GLYCOLAX) 17 g packet Take 17 g by mouth in the morning.   Yes [provider]  potassium chloride (MICRO-K) 10 MEQ CR capsule Take 10 mEq by mouth daily.   Yes [provider]  rosuvastatin (CRESTOR) 20 MG tablet Take 20 mg by mouth at bedtime.   Yes [provider]    Scheduled Meds:  mouth rinse  15 mL Mouth Rinse 4 times per day   pantoprazole (PROTONIX) IV  40 mg Intravenous Q12H   sodium chloride flush  3 mL Intravenous Q12H   Continuous Infusions: PRN Meds:.acetaminophen **OR** acetaminophen, mouth rinse  Allergies as of 02/01/2023 - Review Complete 02/01/2023  Allergen Reaction Noted   Meloxicam Other (See Comments) 07/15/2016   Atorvastatin Other (See Comments) 06/14/2005   Niacin  07/07/2016   Shellfish allergy Other (See Comments) 02/01/2023    History reviewed. No pertinent family history.  Social History   Socioeconomic History   Marital status: Married    Spouse name: Not on file   Number of children: Not on file   Years of education: Not on file   Highest education level: Not on file  Occupational History   Not on file  Tobacco Use   Smoking status: Former    Types: Cigarettes   Smokeless tobacco: Never  Substance and Sexual Activity   Alcohol use: Yes   Drug use: Never   Sexual activity: Not on  file  Other Topics Concern   Not on file  Social History Narrative   Not on file   Social Drivers of Health   Financial Resource Strain: Not on file  Food Insecurity: No Food Insecurity (02/02/2023)   Hunger Vital Sign    Worried About Running Out of Food in the Last Year: Never true    Ran Out of Food in the Last Year: Never true  Transportation Needs: No Transportation Needs (02/02/2023)   PRAPARE - Administrator, Civil Service (Medical): No    Lack of Transportation (Non-Medical): No  Physical Activity: Not on file  Stress: Not on file  Social Connections: Moderately Isolated (02/02/2023)  Social Advertising account executive [NHANES]    Frequency of Communication with Friends and Family: Once a week    Frequency of Social Gatherings with Friends and Family: Once a week    Attends Religious Services: 1 to 4 times per year    Active Member of [employer] or Organizations: Yes    Attends Banker Meetings: 1 to 4 times per year    Marital Status: Widowed  Intimate Partner Violence: Not At Risk (02/02/2023)   Humiliation, Afraid, Rape, and Kick questionnaire    Fear of Current or Ex-Partner: No    Emotionally Abused: No    Physically Abused: No    Sexually Abused: No    Review of Systems: All negative except as stated above in HPI.  Physical Exam: Vital signs: Vitals:   02/02/23 0205 02/02/23 0346  BP: (!) 113/57 133/73  Pulse: 76 72  Resp: 19 20  Temp: 97.8 F (36.6 C) 97.9 F (36.6 C)  SpO2: 92% 100%   Last BM Date : 02/02/23 General:   Alert,  Well-developed, well-nourished, pleasant and cooperative in NAD Lungs: No visible respiratory distress Heart:  Regular rate and rhythm; no murmurs, clicks, rubs,  or gallops. Abdomen: Soft, nontender, nondistended, bowel sound present, no peritoneal signs Mood and affect normal Alert and oriented x 3 Rectal:  Deferred  GI:  Lab Results: Recent Labs    02/01/23 1536 02/02/23 0538  WBC 6.8 5.2   HGB 11.3* 10.9*  HCT 36.4* 35.7*  PLT 139* 134*   BMET Recent Labs    02/01/23 1536 02/02/23 0538  NA 136 139  K 4.4 3.7  CL 99 103  CO2 30 27  GLUCOSE 100* 81  BUN 23 19  CREATININE 0.97 0.78  CALCIUM 9.0 8.9   LFT Recent Labs    02/01/23 1536  PROT 6.7  ALBUMIN 3.6  AST 12*  ALT 10  ALKPHOS 77  BILITOT 0.9   PT/INR No results for input(s): "LABPROT", "INR" in the last 72 hours.   Studies/Results: CT Angio Abd/Pel W and/or Wo Contrast Result Date: 02/01/2023 CLINICAL DATA:  Black tarry stool.  Concern for GI bleed. EXAM: CTA ABDOMEN AND PELVIS WITHOUT AND WITH CONTRAST TECHNIQUE: Multidetector CT imaging of the abdomen and pelvis was performed using the standard protocol during bolus administration of intravenous contrast. Multiplanar reconstructed images and MIPs were obtained and reviewed to evaluate the vascular anatomy. RADIATION DOSE REDUCTION: This exam was performed according to the departmental dose-optimization program which includes automated exposure control, adjustment of the mA and/or kV according to patient size and/or use of iterative reconstruction technique. CONTRAST:  OMNIPAQUE IOHEXOL 350 MG/ML SOLN COMPARISON:  Abdominal CT dated 05/23/2022. FINDINGS: Evaluation of this exam is limited due to respiratory motion. VASCULAR Aorta: Advanced atherosclerotic calcification of the abdominal aorta. There is a 3.4 cm fusiform infrarenal abdominal aortic aneurysm. No aortic dissection. No periaortic fluid collection. Celiac: Atherosclerotic calcification of the origin of the celiac trunk. The celiac artery remains patent. SMA: Advanced atherosclerotic calcification of the origin of the SMA. The SMA remains patent. Renals: Advanced atherosclerotic calcification of the ostia of the renal arteries. The renal arteries are patent. IMA: The IMA is poorly opacified. Inflow: Advanced atherosclerotic calcification of the iliac arteries. The iliac arteries remain patent. No  dissection. Proximal Outflow: Advanced atherosclerotic calcification. The visualized proximal outflow appears patent. Veins: The IVC is unremarkable. The SMV, splenic vein, and main portal vein are patent. No portal venous gas. Review of the  MIP images confirms the above findings. NON-VASCULAR Lower chest: Partially visualized bilateral pleural effusions, right greater than left with associated compressive atelectasis of the adjacent lungs. Pneumonia is not excluded. There is advanced 3 vessel coronary vascular calcification and postsurgical changes of CABG. Cardiac pacemaker noted. No intra-abdominal free air.  No significant free fluid. Hepatobiliary: Heterogeneous enhancement of the liver. No biliary dilatation. Cholecystectomy. Pancreas: Unremarkable. No pancreatic ductal dilatation or surrounding inflammatory changes. Spleen: Normal in size without focal abnormality. Adrenals/Urinary Tract: The adrenal glands unremarkable. There is no hydronephrosis or nephrolithiasis on either side. Renal vascular calcifications noted. 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 This is not characterized on today's exam due to small size and suboptimal visualization. However, there appears to be enhancement on postcontrast images. The urinary bladder is grossly unremarkable. Stomach/Bowel: Evaluation for GI bleed is limited due to presence of high density stool throughout the colon likely mixed with prior oral contrast. There is enhancement of the gastric wall which may be physiologic. Gastritis is less likely. There is severe colonic diverticulosis. There is no bowel obstruction or active inflammation. No evidence of active GI bleed. The appendix is not visualized with certainty. No inflammatory changes identified in the right lower quadrant. Lymphatic: No adenopathy. Reproductive: The prostate is grossly unremarkable. Other: Mild  diffuse subcutaneous edema. Musculoskeletal: Osteopenia with degenerative changes. No acute osseous pathology. IMPRESSION: 1. No evidence of active GI bleed. 2. Severe colonic diverticulosis. No bowel obstruction. 3. Partially visualized bilateral pleural effusions, right greater than left with associated compressive atelectasis of the adjacent lungs. Pneumonia is not excluded. 4. 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 5.  Aortic Atherosclerosis (ICD10-I70.0). Electronically Signed   By: Elgie Collard M.D.   On: 02/01/2023 19:08    Impression/Plan: -One episode of melena in a patient with history of GI bleed back in 2021 requiring small bowel resection.  CT scan showed questionable gastric wall thickening in the antrum. -Acute on chronic anemia. -H/Of A-fib.  On aspirin.  Recommendations -------------------------- -Proceed with EGD today.  Risks (bleeding, infection, bowel perforation that could require surgery, sedation-related changes in cardiopulmonary systems), benefits (identification and possible treatment of source of symptoms, exclusion of certain causes of symptoms), and alternatives (watchful waiting, radiographic imaging studies, empiric medical treatment)  were explained to patient/family in detail and patient wishes to proceed.     LOS: 0 days   Kathi Der  MD, FACP 02/02/2023, 10:10 AM  Contact #  [phone]

## 2023-02-03 DIAGNOSIS — K921 Melena: Secondary | ICD-10-CM | POA: Diagnosis not present

## 2023-02-03 LAB — BASIC METABOLIC PANEL
Anion gap: 10 (ref 5–15)
BUN: 24 mg/dL — ABNORMAL HIGH (ref 8–23)
CO2: 26 mmol/L (ref 22–32)
Calcium: 8.9 mg/dL (ref 8.9–10.3)
Chloride: 102 mmol/L (ref 98–111)
Creatinine, Ser: 0.82 mg/dL (ref 0.61–1.24)
GFR, Estimated: >60 mL/min (ref >60)
Glucose, Bld: 96 mg/dL (ref 70–99)
Potassium: 3.8 mmol/L (ref 3.5–5.1)
Sodium: 138 mmol/L (ref 135–145)

## 2023-02-03 LAB — CBC
HCT: 37.3 % — ABNORMAL LOW (ref 39.0–52.0)
Hemoglobin: 11.3 g/dL — ABNORMAL LOW (ref 13.0–17.0)
MCH: 27 pg (ref 26.0–34.0)
MCHC: 30.3 g/dL (ref 30.0–36.0)
MCV: 89.2 fL (ref 80.0–100.0)
Platelets: 126 10*3/uL — ABNORMAL LOW (ref 150–400)
RBC: 4.18 MIL/uL — ABNORMAL LOW (ref 4.22–5.81)
RDW: 19.5 % — ABNORMAL HIGH (ref 11.5–15.5)
WBC: 5.7 10*3/uL (ref 4.0–10.5)
nRBC: 0 % (ref 0.0–0.2)

## 2023-02-03 NOTE — Progress Notes (Signed)
Mobility Specialist - Progress Note   02/03/23 1114  Mobility  Activity Ambulated with assistance in hallway  Level of Assistance Standby assist, set-up cues, supervision of patient - no hands on  Assistive Device Front wheel walker  Distance Ambulated (ft) 80 ft  Activity Response Tolerated well  Mobility Referral Yes  Mobility visit 1 Mobility  Mobility Specialist Start Time (ACUTE ONLY) 1045  Mobility Specialist Stop Time (ACUTE ONLY) 1112  Mobility Specialist Time Calculation (min) (ACUTE ONLY) 27 min   Pt received in recliner and agreeable to mobility. C/o dizziness towards EOS. Pt to recliner after session with all needs met. MD in room.    [facility]

## 2023-02-03 NOTE — TOC Transition Note (Signed)
Transition of Care [facility]) - Discharge Note   Patient Details  Name: Rodney Ross MRN: [id number] Date of Birth: 1934/05/30  Transition of Care [facility]) CM/SW Contact:  Howell Rucks, RN Phone Number: 02/03/2023, 11:57 AM   Clinical Narrative:   DC to [facility], patient reports his family will provide transportation. No further TOC needs identified.     Final next level of care: Home w Home Health Services Barriers to Discharge: Barriers Resolved   Patient Goals and CMS Choice Patient states their goals for this hospitalization and ongoing recovery are:: return to ILF at [email or web address] Compare Post Acute Care list provided to:: Patient Choice offered to / list presented to : Patient      Discharge Placement                    Patient and family notified of of transfer: 02/03/23  Discharge Plan and Services Additional resources added to the After Visit Summary for                            [facility] Arranged: PT [facility] Agency: Other - See comment Date HH Agency Contacted: 02/03/23 Time HH Agency Contacted: 1156    Social Drivers of Health (SDOH) Interventions SDOH Screenings   Food Insecurity: No Food Insecurity (02/02/2023)  Housing: Low Risk  (02/02/2023)  Transportation Needs: No Transportation Needs (02/02/2023)  Utilities: Not At Risk (02/02/2023)  Social Connections: Moderately Isolated (02/02/2023)  Tobacco Use: Medium Risk (02/02/2023)     Readmission Risk Interventions     No data to display

## 2023-02-03 NOTE — Progress Notes (Signed)
Discharge paper work printed and reviewed with the patient and family. Family took all the belongings. Pt transported out on the wheelchair by the NT. Patient and family had no concerns made.

## 2023-02-03 NOTE — TOC Progression Note (Signed)
Transition of Care [facility]) - Progression Note    Patient Details  Name: Rodney Ross MRN: [id number] Date of Birth: Jun 10, 1934  Transition of Care [facility]) CM/SW Contact  Howell Rucks, RN Phone Number: 02/03/2023, 9:34 AM  Clinical Narrative:  Met with pt at bedside to introduce self and review for dc planning, PT recommendation for [facility] PT, pt agreeable, pt resides in ILF at [place], facility will provide PT. [place], admissions coord at [facility] notified, request sent to attending to enter [facility] PT order. MOON completed.  TOC will continue to follow.          Barriers to Discharge: Continued Medical Work up  Expected Discharge Plan and Services                                               Social Determinants of Health (SDOH) Interventions SDOH Screenings   Food Insecurity: No Food Insecurity (02/02/2023)  Housing: Low Risk  (02/02/2023)  Transportation Needs: No Transportation Needs (02/02/2023)  Utilities: Not At Risk (02/02/2023)  Social Connections: Moderately Isolated (02/02/2023)  Tobacco Use: Medium Risk (02/02/2023)    Readmission Risk Interventions     No data to display

## 2023-02-03 NOTE — Care Management Obs Status (Signed)
MEDICARE OBSERVATION STATUS NOTIFICATION   Patient Details  Name: Rodney Ross MRN: [id number] Date of Birth: [birth year]   Medicare Observation Status Notification Given:  Yes    Howell Rucks, RN 02/03/2023, 9:26 AM

## 2023-02-03 NOTE — Plan of Care (Signed)
Problem: Coping: Goal: Level of anxiety will decrease Outcome: Progressing   Problem: Pain Managment: Goal: General experience of comfort will improve and/or be controlled Outcome: Progressing   Problem: Safety: Goal: Ability to remain free from injury will improve Outcome: Progressing

## 2023-02-03 NOTE — Plan of Care (Signed)
  Problem: Nutrition: Goal: Adequate nutrition will be maintained Outcome: Progressing   Problem: Coping: Goal: Level of anxiety will decrease Outcome: Progressing   Problem: Elimination: Goal: Will not experience complications related to urinary retention Outcome: Progressing   Problem: Pain Managment: Goal: General experience of comfort will improve and/or be controlled Outcome: Progressing   Problem: Safety: Goal: Ability to remain free from injury will improve Outcome: Progressing   Problem: Fluid Volume: Goal: Will show no signs and symptoms of excessive bleeding Outcome: Progressing

## 2023-02-03 NOTE — Discharge Summary (Signed)
Physician Discharge Summary   Patient: Rodney Ross MRN: [id number] DOB: [birth year]  Admit date:     02/01/2023  Discharge date: 02/03/23  Discharge Physician: Alberteen Sam   PCP: [facility]     Recommendations at discharge:  Follow-up with PCP in 1 week for orthostatic hypotension Follow-up with gastroenterology as needed for history of GI bleeding Please arrange Urology follow up for 8mm right kidney lesion     Discharge Diagnoses: Principal Problem:   Melena Active Problems:   Orthostatic hypotension   Coronary artery disease   Permanent atrial fibrillation (HCC)   Chronic HFrEF (heart failure with reduced ejection fraction) (HCC)   Kidney lesion, native, right   Normocytic anemia   Pressure injury of skin   History of bradycardia s/p pacemaker      Hospital Course: 89 y.o. M with hx small bowel hemorrhage requiring resection, CAD s/p CABG, AF not on AC, hx Watchman implant, HFrEF EF 35-40%, and hx bradycardia s/p PPM who presented with melena.      * Melena This was the initial presenting complaint.  The patient was observed overnight, had no further bowel movements, serial hemoglobin remained stable.  FOBT negative, evaluated by GI who performed endoscopy that showed mild gastritis only.  They recommended continuing home pantoprazole, as needed follow-up.    Orthostatic hypotension This became the main attention focus at discharge.  The patient reported he had had many months of dizziness with standing.  Here he was orthostatic with BP 130s laying, 110s sitting and 90s standing.  He was given IV fluids overnight, furosemide and Coreg were held at discharge.  He was provided with TED hose and abdominal binder.  Recommend close PCP follow-up.  Resume Coreg and furosemide at discretion of PCP.  If the patient continues to have symptoms, despite TED hose and abdominal binder, would recommend consideration of  fludrocortisone, salt liberal diet, midodrine.   History of bradycardia s/p pacemaker No arrhythmia noted in the hospital  Pressure injury of skin Medial buttocks, stage II, present on admission  Normocytic anemia Hemoglobin remained stable   Kidney lesion, native, right Incidental finding.  Recommend urology follow-up after discharge for further imaging.  Chronic HFrEF (heart failure with reduced ejection fraction) (HCC) Appears euvolemic.  Lasix was held at discharge.  Permanent atrial fibrillation (HCC) Not on anticoagulation, has Watchman.  Carvedilol held at discharge due to orthostatic hypotension.  Coronary artery disease On aspirin, Crestor            [facility] Controlled Substances Registry was reviewed for this patient prior to discharge.  Consultants: Gastroenterology, Dr. Levora Angel, [facility]  Procedures performed:  EGD  Disposition: Long term care facility Diet recommendation:  Cardiac diet  DISCHARGE MEDICATION: Allergies as of 02/03/2023       Reactions   Meloxicam Other (See Comments)   Congestive Heart Failure Exacerbation Syncopal Episode while driving   Atorvastatin Other (See Comments)   Niacin    Other reaction(s): Flushing (ALLERGY/intolerance)   Shellfish Allergy Other (See Comments)   flushing        Medication List     PAUSE taking these medications    carvedilol 3.125 MG tablet Wait to take this until your doctor or other care provider tells you to start again. Commonly known as: COREG Take 3.125 mg by mouth 2 (two) times daily with a meal. Check BP and HR Hold if HR is < 60 Hold if sys is < 100   furosemide  20 MG tablet Wait to take this until your doctor or other care provider tells you to start again. Commonly known as: LASIX Take 20 mg by mouth in the morning.   potassium chloride 10 MEQ CR capsule Wait to take this until your doctor or other care provider tells you to start again. Commonly known as:  MICRO-K Take 10 mEq by mouth daily.       TAKE these medications    acetaminophen 650 MG CR tablet Commonly known as: TYLENOL Take 650 mg by mouth 2 (two) times daily as needed for pain.   alendronate 70 MG tablet Commonly known as: FOSAMAX Take 70 mg by mouth once a week. Monday's   aspirin EC 81 MG tablet Take 81 mg by mouth in the morning. Swallow whole.   B-12 PO Take 1 tablet by mouth in the morning.   benzonatate 200 MG capsule Commonly known as: TESSALON Take 200 mg by mouth 3 (three) times daily as needed for cough.   dicyclomine 10 MG capsule Commonly known as: BENTYL Take 10 mg by mouth 2 (two) times daily as needed for spasms.   finasteride 5 MG tablet Commonly known as: PROSCAR Take 5 mg by mouth at bedtime.   gabapentin 300 MG capsule Commonly known as: NEURONTIN Take 300 mg by mouth 2 (two) times daily.   guaifenesin 100 MG/5ML syrup Commonly known as: ROBITUSSIN Take 200 mg by mouth 3 (three) times daily as needed for cough.   magnesium oxide 400 (240 Mg) MG tablet Commonly known as: MAG-OX Take 400 mg by mouth 2 (two) times daily.   nitroGLYCERIN 0.4 MG SL tablet Commonly known as: NITROSTAT Place 0.4 mg under the tongue every 5 (five) minutes as needed for chest pain (x 2 doses).   nortriptyline 10 MG capsule Commonly known as: PAMELOR Take 10 mg by mouth at bedtime.   pantoprazole 40 MG tablet Commonly known as: PROTONIX Take 40 mg by mouth 2 (two) times daily.   polyethylene glycol 17 g packet Commonly known as: MIRALAX / GLYCOLAX Take 17 g by mouth in the morning.   rosuvastatin 20 MG tablet Commonly known as: CRESTOR Take 20 mg by mouth at bedtime.        Follow-up Information     Your primary care doctor. Schedule an appointment as soon as possible for a visit in 1 week(s).                  Discharge Instructions     Discharge instructions   Complete by: As directed    **IMPORTANT DISCHARGE  INSTRUCTIONS**   From Dr. Maryfrances Bunnell: You were admitted for evaluation of a dark stool Here, you were seen by a gastroenterology, Dr. Levora Angel from [facility] and you had an EGD. The EGD was reassuring and you had stable blood levels here and no further bloody appearing stools  You should continue your home pantoprazole  You were also found here to have orthostatic hypotension (blood pressure dropping with standing)  As we discussed, this is from natural deterioration of the nerves controlling blood pressure regulation with age.   Specifically, the nerves in the legs/abdomen that constrict when you stand, to prevent blood pooling in the feet or belly, stop working well  For this reason, we are giving you compression stockings and an abdominal binder  Wear these when you are planning to get up and walk, to prevent blood pressure drops  This is an imperfect treatment, which doesn't always work  So be careful in the coming weeks --> when you stand, you should wait a minute to see if you are steady before going anywhere  Eventually, if the comrpession stockings and binder do not resolve symptoms enough, one can try medicines, but these have side effects and are also not perfectly effective  Go see your primay in 1 week  For now, HOLD (do not take) your furosemide and Coreg, until you see your doctor  Ask your primary also for a referral to a Urologist or for them to order repeat imaging of your kidney mass on your right kidney to better evaluate what this is   Increase activity slowly   Complete by: As directed    No wound care   Complete by: As directed        Discharge Exam: Filed Weights   02/01/23 1525  Weight: 57.2 kg    General: Pt is alert, awake, not in acute distress Cardiovascular: RRR, nl S1-S2, no murmurs appreciated.   No LE edema.   Respiratory: Normal respiratory rate and rhythm.  CTAB without rales or wheezes. Abdominal: Abdomen soft and non-tender.  No distension  or HSM.   Neuro/Psych: Strength symmetric in upper and lower extremities.  Judgment and insight appear normal.   Condition at discharge: fair  The results of significant diagnostics from this hospitalization (including imaging, microbiology, ancillary and laboratory) are listed below for reference.   Imaging Studies: CT Angio Abd/Pel W and/or Wo Contrast Result Date: 02/01/2023 CLINICAL DATA:  Black tarry stool.  Concern for GI bleed. EXAM: CTA ABDOMEN AND PELVIS WITHOUT AND WITH CONTRAST TECHNIQUE: Multidetector CT imaging of the abdomen and pelvis was performed using the standard protocol during bolus administration of intravenous contrast. Multiplanar reconstructed images and MIPs were obtained and reviewed to evaluate the vascular anatomy. RADIATION DOSE REDUCTION: This exam was performed according to the departmental dose-optimization program which includes automated exposure control, adjustment of the mA and/or kV according to patient size and/or use of iterative reconstruction technique. CONTRAST:  OMNIPAQUE IOHEXOL 350 MG/ML SOLN COMPARISON:  Abdominal CT dated 05/23/2022. FINDINGS: Evaluation of this exam is limited due to respiratory motion. VASCULAR Aorta: Advanced atherosclerotic calcification of the abdominal aorta. There is a 3.4 cm fusiform infrarenal abdominal aortic aneurysm. No aortic dissection. No periaortic fluid collection. Celiac: Atherosclerotic calcification of the origin of the celiac trunk. The celiac artery remains patent. SMA: Advanced atherosclerotic calcification of the origin of the SMA. The SMA remains patent. Renals: Advanced atherosclerotic calcification of the ostia of the renal arteries. The renal arteries are patent. IMA: The IMA is poorly opacified. Inflow: Advanced atherosclerotic calcification of the iliac arteries. The iliac arteries remain patent. No dissection. Proximal Outflow: Advanced atherosclerotic calcification. The visualized proximal outflow appears  patent. Veins: The IVC is unremarkable. The SMV, splenic vein, and main portal vein are patent. No portal venous gas. Review of the MIP images confirms the above findings. NON-VASCULAR Lower chest: Partially visualized bilateral pleural effusions, right greater than left with associated compressive atelectasis of the adjacent lungs. Pneumonia is not excluded. There is advanced 3 vessel coronary vascular calcification and postsurgical changes of CABG. Cardiac pacemaker noted. No intra-abdominal free air.  No significant free fluid. Hepatobiliary: Heterogeneous enhancement of the liver. No biliary dilatation. Cholecystectomy. Pancreas: Unremarkable. No pancreatic ductal dilatation or surrounding inflammatory changes. Spleen: Normal in size without focal abnormality. Adrenals/Urinary Tract: The adrenal glands unremarkable. There is no hydronephrosis or nephrolithiasis on either side. Renal vascular calcifications noted. Poorly visualized 8  mm exophytic hypodense lesion from the posterior inferior pole of the right kidney, previously considered neoplastic. This is not characterized on today's exam due to small size and suboptimal visualization. However, there appears to be enhancement on postcontrast images. The urinary bladder is grossly unremarkable. Stomach/Bowel: Evaluation for GI bleed is limited due to presence of high density stool throughout the colon likely mixed with prior oral contrast. There is enhancement of the gastric wall which may be physiologic. Gastritis is less likely. There is severe colonic diverticulosis. There is no bowel obstruction or active inflammation. No evidence of active GI bleed. The appendix is not visualized with certainty. No inflammatory changes identified in the right lower quadrant. Lymphatic: No adenopathy. Reproductive: The prostate is grossly unremarkable. Other: Mild diffuse subcutaneous edema. Musculoskeletal: Osteopenia with degenerative changes. No acute osseous pathology.  IMPRESSION: 1. No evidence of active GI bleed. 2. Severe colonic diverticulosis. No bowel obstruction. 3. Partially visualized bilateral pleural effusions, right greater than left with associated compressive atelectasis of the adjacent lungs. Pneumonia is not excluded. 4. 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 5.  Aortic Atherosclerosis (ICD10-I70.0). Electronically Signed   By: Elgie Collard M.D.   On: 02/01/2023 19:08    Microbiology: Results for orders placed or performed during the hospital encounter of 01/09/22  Resp panel by RT-PCR (RSV, Flu A&B, Covid) Anterior Nasal Swab     Status: Abnormal   Collection Time: 01/09/22 10:29 PM   Specimen: Anterior Nasal Swab  Result Value Ref Range Status   SARS Coronavirus 2 by RT PCR NEGATIVE NEGATIVE Final    Comment: (NOTE) SARS-CoV-2 target nucleic acids are NOT DETECTED.  The SARS-CoV-2 RNA is generally detectable in upper respiratory specimens during the acute phase of infection. The lowest concentration of SARS-CoV-2 viral copies this assay can detect is 138 copies/mL. A negative result does not preclude SARS-Cov-2 infection and should not be used as the sole basis for treatment or other patient management decisions. A negative result may occur with  improper specimen collection/handling, submission of specimen other than nasopharyngeal swab, presence of viral mutation(s) within the areas targeted by this assay, and inadequate number of viral copies(<138 copies/mL). A negative result must be combined with clinical observations, patient history, and epidemiological information. The expected result is Negative.  Fact Sheet for Patients:  BloggerCourse.com  Fact Sheet for Healthcare Providers:  SeriousBroker.it  This test is no t yet approved or cleared by the [place] FDA and  has  been authorized for detection and/or diagnosis of SARS-CoV-2 by FDA under an Emergency Use Authorization (EUA). This EUA will remain  in effect (meaning this test can be used) for the duration of the COVID-19 declaration under Section 564(b)(1) of the Act, 21 U.S.C.section 360bbb-3(b)(1), unless the authorization is terminated  or revoked sooner.       Influenza A by PCR POSITIVE (A) NEGATIVE Final   Influenza B by PCR NEGATIVE NEGATIVE Final    Comment: (NOTE) The Xpert Xpress SARS-CoV-2/FLU/RSV plus assay is intended as an aid in the diagnosis of influenza from Nasopharyngeal swab specimens and should not be used as a sole basis for treatment. Nasal washings and aspirates are unacceptable for Xpert Xpress SARS-CoV-2/FLU/RSV testing.  Fact Sheet for Patients: BloggerCourse.com  Fact Sheet for Healthcare Providers: SeriousBroker.it  This test is not yet approved or cleared by the [place] FDA and has been authorized for detection and/or diagnosis of SARS-CoV-2 by FDA  under an Emergency Use Authorization (EUA). This EUA will remain in effect (meaning this test can be used) for the duration of the COVID-19 declaration under Section 564(b)(1) of the Act, 21 U.S.C. section 360bbb-3(b)(1), unless the authorization is terminated or revoked.     Resp Syncytial Virus by PCR NEGATIVE NEGATIVE Final    Comment: (NOTE) Fact Sheet for Patients: BloggerCourse.com  Fact Sheet for Healthcare Providers: SeriousBroker.it  This test is not yet approved or cleared by the [place] FDA and has been authorized for detection and/or diagnosis of SARS-CoV-2 by FDA under an Emergency Use Authorization (EUA). This EUA will remain in effect (meaning this test can be used) for the duration of the COVID-19 declaration under Section 564(b)(1) of the Act, 21 U.S.C. section 360bbb-3(b)(1), unless  the authorization is terminated or revoked.  Performed at [facility], [address], [place], [place] [address]     Labs: CBC: Recent Labs  Lab 02/01/23 1536 02/02/23 0538 02/02/23 1119 02/02/23 1544 02/03/23 0334  WBC 6.8 5.2 4.2 4.3 5.7  HGB 11.3* 10.9* 11.0* 11.6* 11.3*  HCT 36.4* 35.7* 35.4* 38.9* 37.3*  MCV 86.1 88.6 86.3 89.2 89.2  PLT 139* 134* 146* 148* 126*   Basic Metabolic Panel: Recent Labs  Lab 02/01/23 1536 02/02/23 0538 02/03/23 0334  NA 136 139 138  K 4.4 3.7 3.8  CL 99 103 102  CO2 30 27 26   GLUCOSE 100* 81 96  BUN 23 19 24*  CREATININE 0.97 0.78 0.82  CALCIUM 9.0 8.9 8.9   Liver Function Tests: Recent Labs  Lab 02/01/23 1536  AST 12*  ALT 10  ALKPHOS 77  BILITOT 0.9  PROT 6.7  ALBUMIN 3.6   CBG: No results for input(s): "GLUCAP" in the last 168 hours.  Discharge time spent: approximately 45 minutes spent on discharge counseling, evaluation of patient on day of discharge, and coordination of discharge planning with nursing, social work, pharmacy and case management  Signed: Alberteen Sam, MD [facility] 02/03/2023

## 2023-02-06 ENCOUNTER — Encounter (HOSPITAL_COMMUNITY): Payer: Self-pay | Admitting: Gastroenterology

## 2023-02-06 LAB — SURGICAL PATHOLOGY

## 2023-02-06 NOTE — Anesthesia Postprocedure Evaluation (Signed)
Anesthesia Post Note  Patient: Rodney Ross  Procedure(s) Performed: ESOPHAGOGASTRODUODENOSCOPY (EGD) WITH PROPOFOL BIOPSY     Patient location during evaluation: PACU Anesthesia Type: MAC Level of consciousness: awake and alert Pain management: pain level controlled Vital Signs Assessment: post-procedure vital signs reviewed and stable Respiratory status: spontaneous breathing, nonlabored ventilation, respiratory function stable and patient connected to nasal cannula oxygen Cardiovascular status: stable and blood pressure returned to baseline Postop Assessment: no apparent nausea or vomiting Anesthetic complications: no   No notable events documented.  Last Vitals:  Vitals:   02/03/23 0503 02/03/23 1156  BP: 131/66 128/76  Pulse: 65 74  Resp: 16 16  Temp: 36.8 C (!) 36.4 C  SpO2: 93% 98%    Last Pain:  Vitals:   02/03/23 1156  TempSrc: Oral  PainSc:                  Kennieth Rad

## 2023-05-11 DEATH — deceased

## 2023-08-29 IMAGING — US US EXTREM LOW VENOUS*R*
2 series · 13 of 24 positions shown · non-contrast
Comparison: None.

CLINICAL DATA: Possible DVT on MRI yesterday

EXAM:
RIGHT LOWER EXTREMITY VENOUS DOPPLER ULTRASOUND
TECHNIQUE: Gray-scale sonography with compression, as well as color and duplex
ultrasound, were performed to evaluate the deep venous system(s)
from the level of the common femoral vein through the popliteal and
proximal calf veins.

[Series 1: us extrem low venous*right* · 12 of 48 slices shown (1 of 2)]
[im 1/48]
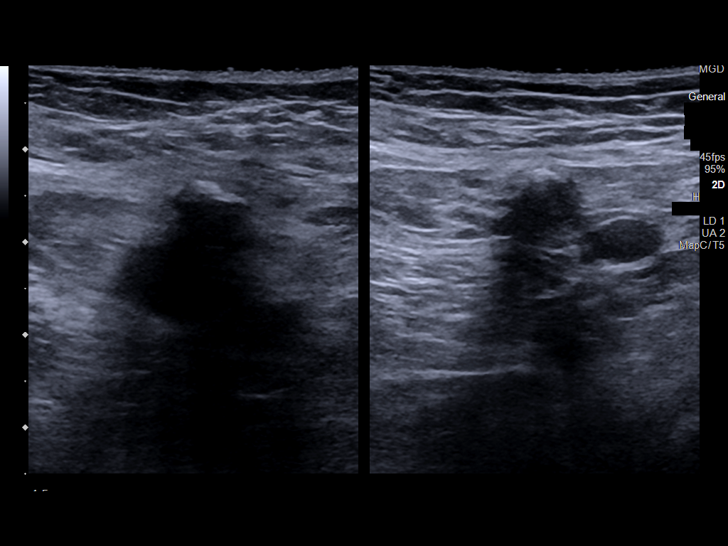
[im 5/48]
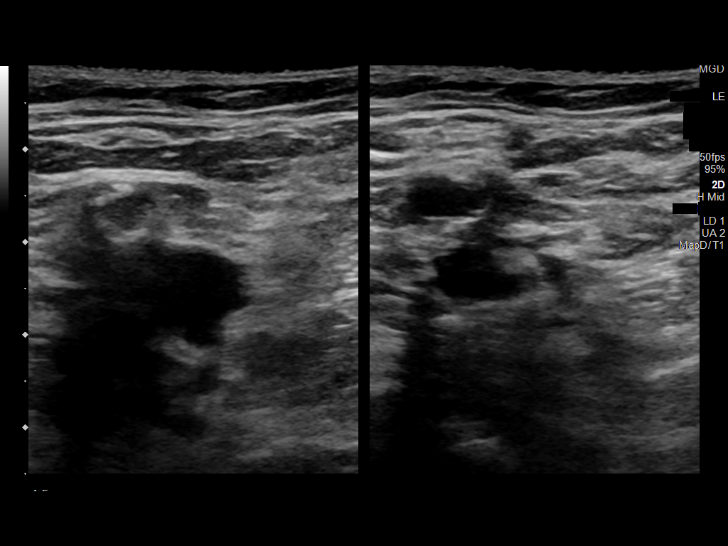
[im 9/48]
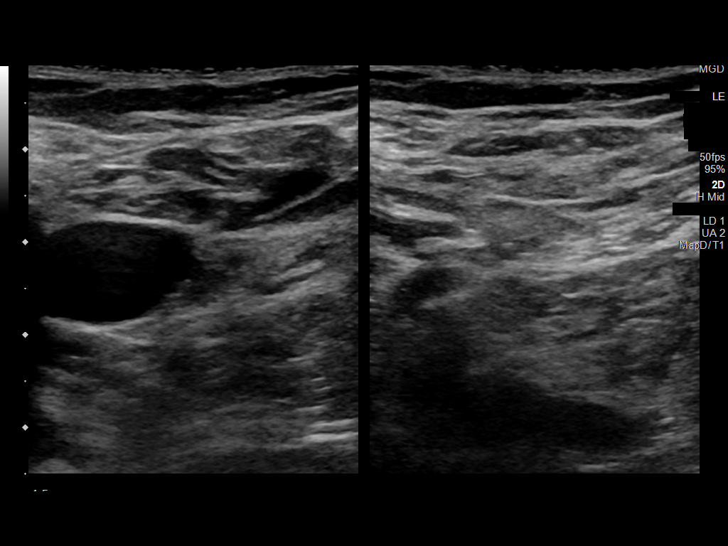
[im 13/48]
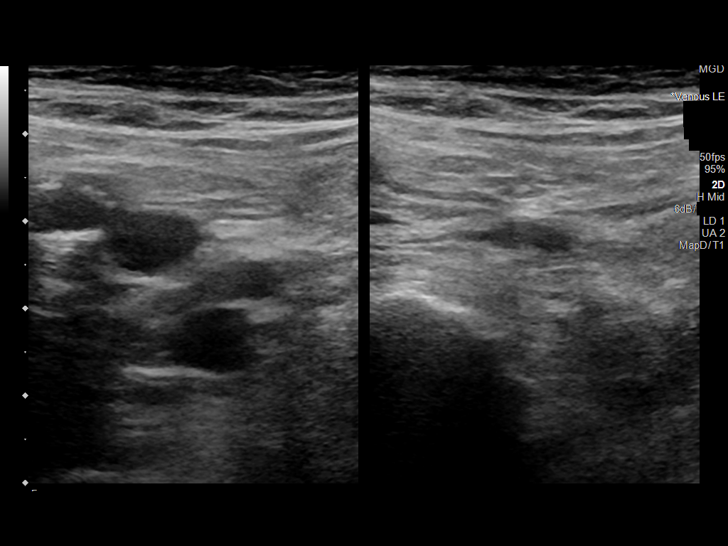
[im 18/48]
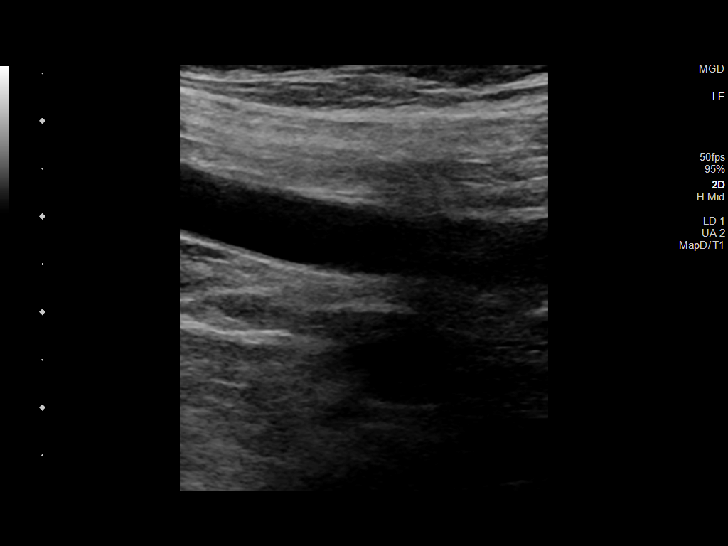
[im 22/48]
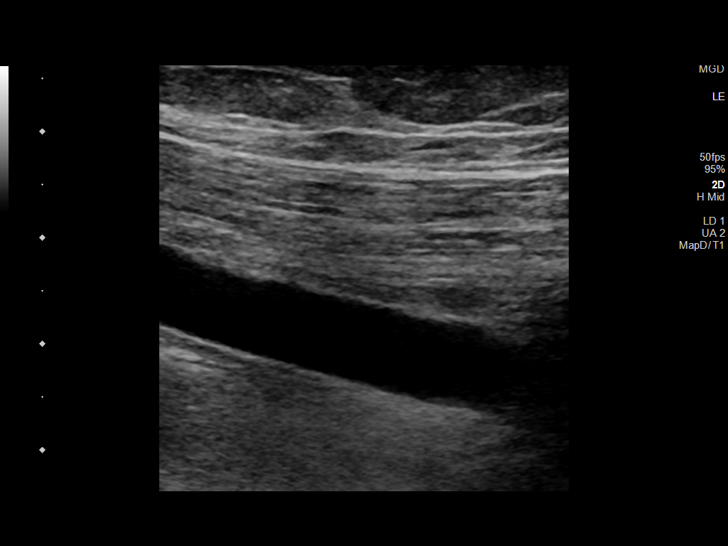
[im 26/48]
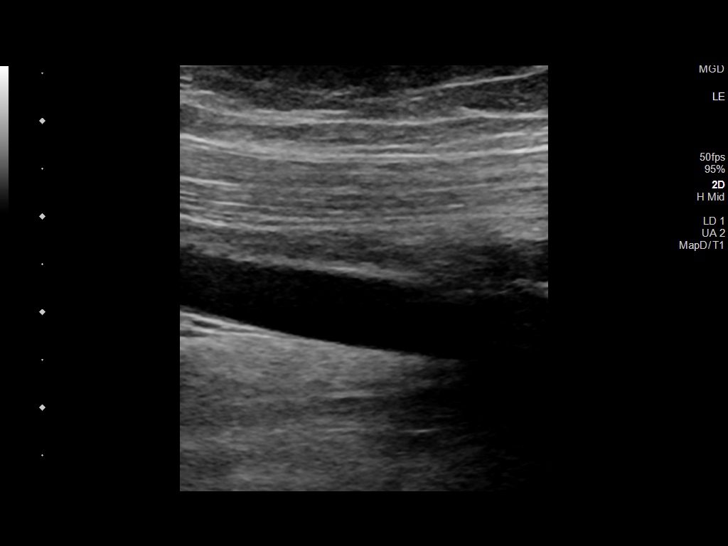
[im 28/48]
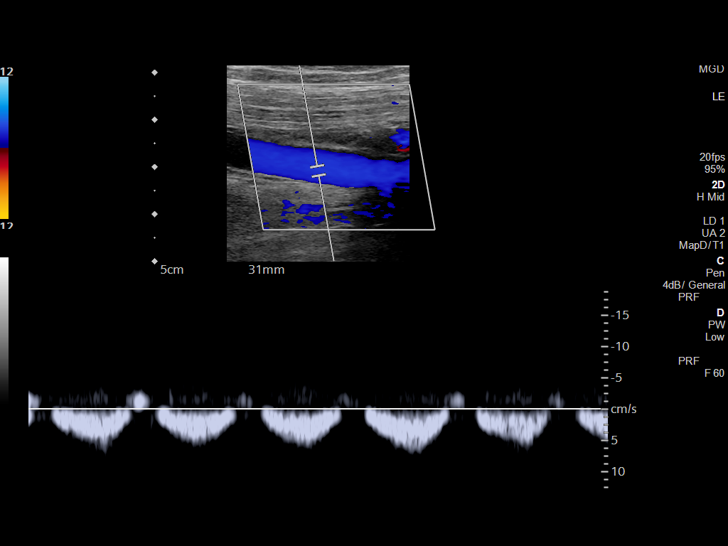
[im 33/48]
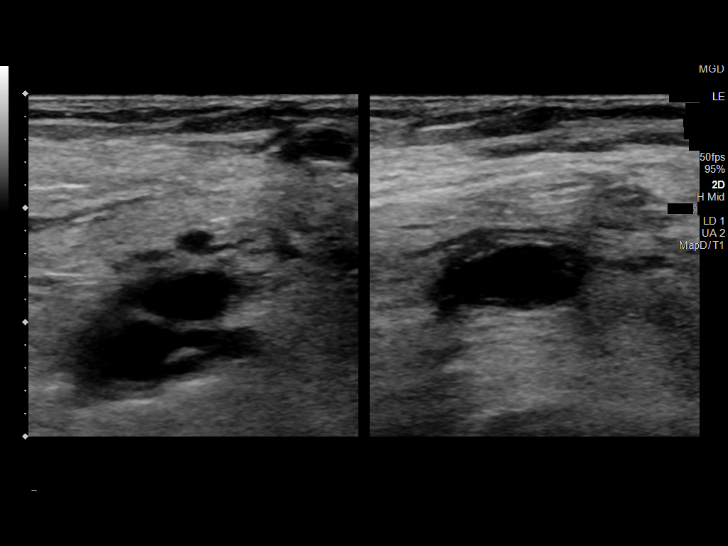
[im 37/48]
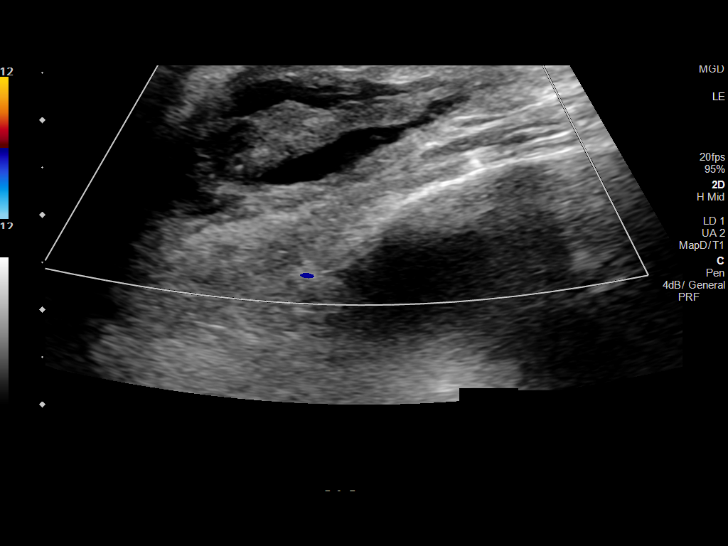
[im 41/48]
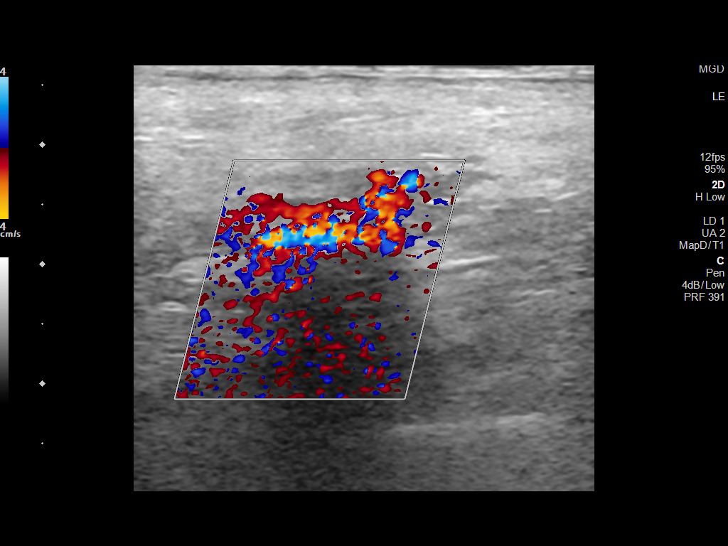
[im 45/48]
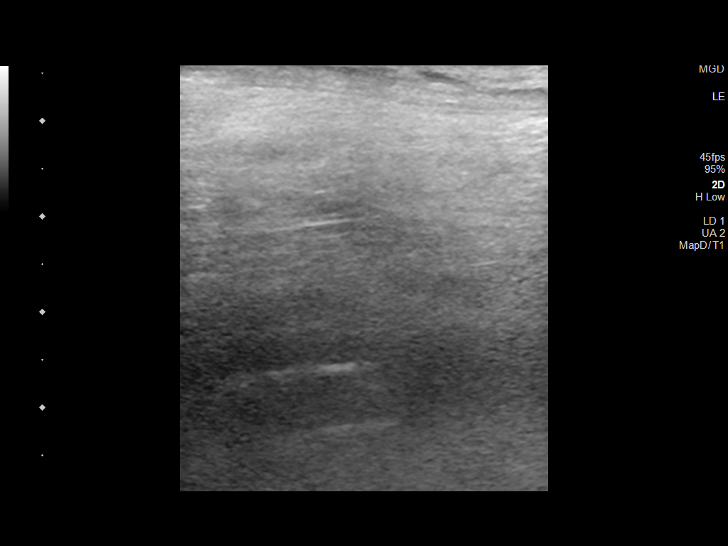

[Series 2: us extrem low venous*right* · 1 of 3 slices shown (2 of 2)]
[im 1/3]
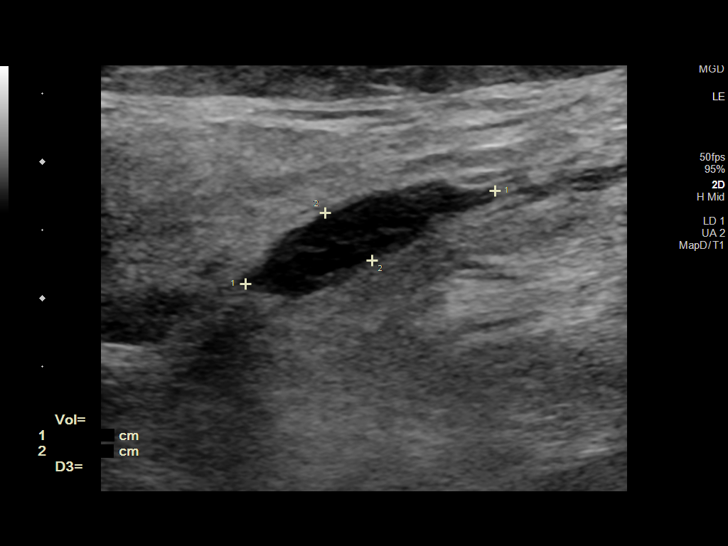

[13 of 24 positions shown; findings below may reference images not displayed]

FINDINGS: VENOUS

Normal compressibility of the common femoral, superficial femoral,
and popliteal veins, as well as the visualized calf veins.
Visualized portions of profunda femoral vein and great saphenous
vein unremarkable. No filling defects to suggest DVT on grayscale or
color Doppler imaging. Doppler waveforms show normal direction of
venous flow, normal respiratory plasticity and response to
augmentation.

Limited views of the contralateral common femoral vein are
unremarkable.

OTHER

Right knee joint effusion is present. Small fluid collection in the
popliteal fossa measuring 2.0 x 0.5 x 0.5 cm is most likely a baker
cyst.

Limitations: none
IMPRESSION: No right lower extremity DVT.
